# Patient Record
Sex: Female | Born: 1950
Health system: Southern US, Community
[De-identification: ages and names within clinical notes are randomized; demographics above are authoritative.]

## PROBLEM LIST (undated history)

## (undated) DIAGNOSIS — M1990 Unspecified osteoarthritis, unspecified site: Secondary | ICD-10-CM

## (undated) DIAGNOSIS — I499 Cardiac arrhythmia, unspecified: Secondary | ICD-10-CM

## (undated) DIAGNOSIS — J189 Pneumonia, unspecified organism: Secondary | ICD-10-CM

## (undated) DIAGNOSIS — I10 Essential (primary) hypertension: Secondary | ICD-10-CM

## (undated) DIAGNOSIS — N898 Other specified noninflammatory disorders of vagina: Secondary | ICD-10-CM

## (undated) DIAGNOSIS — E785 Hyperlipidemia, unspecified: Secondary | ICD-10-CM

## (undated) HISTORY — DX: Essential (primary) hypertension: I10

## (undated) HISTORY — DX: Cardiac arrhythmia, unspecified: I49.9

## (undated) HISTORY — PX: CHOLECYSTECTOMY: SHX55

## (undated) HISTORY — PX: APPENDECTOMY: SHX54

## (undated) HISTORY — DX: Hyperlipidemia, unspecified: E78.5

## (undated) HISTORY — DX: Other specified noninflammatory disorders of vagina: N89.8

---

## 1991-07-19 HISTORY — PX: TEMPOROMANDIBULAR JOINT SURGERY: SHX35

## 2005-03-03 ENCOUNTER — Ambulatory Visit (HOSPITAL_COMMUNITY): Admission: RE | Admit: 2005-03-03 | Discharge: 2005-03-03 | Payer: Self-pay | Admitting: Obstetrics and Gynecology

## 2006-01-23 ENCOUNTER — Encounter: Admission: RE | Admit: 2006-01-23 | Discharge: 2006-01-23 | Payer: Self-pay | Admitting: Family Medicine

## 2007-01-18 ENCOUNTER — Ambulatory Visit (HOSPITAL_COMMUNITY): Admission: RE | Admit: 2007-01-18 | Discharge: 2007-01-18 | Payer: Self-pay | Admitting: Family Medicine

## 2007-02-14 ENCOUNTER — Ambulatory Visit (HOSPITAL_COMMUNITY): Admission: RE | Admit: 2007-02-14 | Discharge: 2007-02-14 | Payer: Self-pay | Admitting: Family Medicine

## 2008-07-01 ENCOUNTER — Other Ambulatory Visit: Admission: RE | Admit: 2008-07-01 | Discharge: 2008-07-01 | Payer: Self-pay | Admitting: Obstetrics and Gynecology

## 2009-12-28 ENCOUNTER — Other Ambulatory Visit
Admission: RE | Admit: 2009-12-28 | Discharge: 2009-12-28 | Payer: Self-pay | Source: Home / Self Care | Admitting: Internal Medicine

## 2010-01-21 ENCOUNTER — Ambulatory Visit (HOSPITAL_COMMUNITY)
Admission: RE | Admit: 2010-01-21 | Discharge: 2010-01-21 | Payer: Self-pay | Source: Home / Self Care | Admitting: Obstetrics and Gynecology

## 2011-02-03 ENCOUNTER — Other Ambulatory Visit: Payer: Self-pay | Admitting: Obstetrics and Gynecology

## 2011-02-03 DIAGNOSIS — Z139 Encounter for screening, unspecified: Secondary | ICD-10-CM

## 2011-02-08 ENCOUNTER — Other Ambulatory Visit: Payer: Self-pay | Admitting: Adult Health

## 2011-02-08 ENCOUNTER — Ambulatory Visit (HOSPITAL_COMMUNITY)
Admission: RE | Admit: 2011-02-08 | Discharge: 2011-02-08 | Disposition: A | Discharge status: Home / Self Care | Payer: BC Managed Care – PPO | Source: Ambulatory Visit | Attending: Obstetrics and Gynecology | Admitting: Obstetrics and Gynecology

## 2011-02-08 ENCOUNTER — Other Ambulatory Visit (HOSPITAL_COMMUNITY)
Admission: RE | Admit: 2011-02-08 | Discharge: 2011-02-08 | Disposition: A | Discharge status: Home / Self Care | Payer: BC Managed Care – PPO | Source: Ambulatory Visit | Attending: Obstetrics and Gynecology | Admitting: Obstetrics and Gynecology

## 2011-02-08 DIAGNOSIS — Z139 Encounter for screening, unspecified: Secondary | ICD-10-CM

## 2011-02-08 DIAGNOSIS — Z1231 Encounter for screening mammogram for malignant neoplasm of breast: Secondary | ICD-10-CM | POA: Insufficient documentation

## 2011-02-08 DIAGNOSIS — Z01419 Encounter for gynecological examination (general) (routine) without abnormal findings: Secondary | ICD-10-CM | POA: Insufficient documentation

## 2011-02-16 ENCOUNTER — Telehealth: Payer: Self-pay

## 2011-02-16 NOTE — Telephone Encounter (Signed)
Gastroenterology Pre-Procedure Form  Request Date: 02/09/2011     Requesting Physician: Cyril Mourning, [facility]     PATIENT INFORMATION:  Pamela Walter is a 60 y.o., female (DOB=22-Jul-1950).  PROCEDURE: Procedure(s) requested: colonoscopy Procedure Reason: screening for colon cancer  PATIENT REVIEW QUESTIONS: The patient reports the following:   1. Diabetes Melitis: no 2. Joint replacements in the past 12 months: no 3. Major health problems in the past 3 months: no 4. Has an artificial valve or MVP:no 5. Has been advised in past to take antibiotics in advance of a procedure like teeth cleaning: no}    MEDICATIONS & ALLERGIES:    Patient reports the following regarding taking any blood thinners:   Plavix? no Aspirin? Yes Coumadin? No  Patient confirms/reports the following medications:  Current Outpatient Prescriptions  Medication Sig Dispense Refill  . aspirin 81 MG tablet Take 81 mg by mouth daily.        . Biotin 1000 MCG tablet Take 1,000 mcg by mouth 2 (two) times daily before a meal.        . metoprolol tartrate (LOPRESSOR) 25 MG tablet Take 25 mg by mouth 1 day or 1 dose.        . naproxen sodium (ANAPROX) 220 MG tablet Take 220 mg by mouth 2 (two) times daily with a meal. Takes two tablets once a day       . Omega-3 Fatty Acids (FISH OIL) 1000 MG CAPS Take by mouth 1 day or 1 dose.        Marland Kitchen UNKNOWN TO PATIENT Vitamin D 400 IU daily       . vitamin B-12 (CYANOCOBALAMIN) 500 MCG tablet Take 500 mcg by mouth daily.          Patient confirms/reports the following allergies:  No Known Allergies  Patient is appropriate to schedule for requested procedure(s): yes  AUTHORIZATION INFORMATION Primary Insurance: BCBS     ID #: [id number]     Group Pre-Cert / Berkley Harvey required:   Secondary Insurance:   ID   Group Pre-Cert / Berkley Harvey required: Pre-Cert / Auth    No orders of the defined types were placed in this encounter.    SCHEDULE INFORMATION: Procedure has been  scheduled as follows:  Date: 03/01/2011    Time: 7:30 AM  Location: [facility] Short Stay  This Gastroenterology Pre-Precedure Form is being routed to the following provider(s) for review: Jonette Eva, MD   Mailed the Rx and instructions.

## 2011-02-17 NOTE — Telephone Encounter (Signed)
moviprep

## 2011-03-01 ENCOUNTER — Encounter (HOSPITAL_COMMUNITY)
Admission: RE | Disposition: A | Discharge status: Home / Self Care | Payer: Self-pay | Source: Ambulatory Visit | Attending: Gastroenterology

## 2011-03-01 ENCOUNTER — Encounter: Payer: BC Managed Care – PPO | Admitting: Gastroenterology

## 2011-03-01 ENCOUNTER — Ambulatory Visit (HOSPITAL_COMMUNITY)
Admission: RE | Admit: 2011-03-01 | Discharge: 2011-03-01 | Disposition: A | Discharge status: Home / Self Care | Payer: BC Managed Care – PPO | Source: Ambulatory Visit | Attending: Gastroenterology | Admitting: Gastroenterology

## 2011-03-01 ENCOUNTER — Encounter (HOSPITAL_COMMUNITY): Payer: Self-pay

## 2011-03-01 DIAGNOSIS — Z1211 Encounter for screening for malignant neoplasm of colon: Secondary | ICD-10-CM | POA: Insufficient documentation

## 2011-03-01 DIAGNOSIS — K648 Other hemorrhoids: Secondary | ICD-10-CM | POA: Insufficient documentation

## 2011-03-01 DIAGNOSIS — Z7982 Long term (current) use of aspirin: Secondary | ICD-10-CM | POA: Insufficient documentation

## 2011-03-01 HISTORY — PX: COLONOSCOPY: SHX5424

## 2011-03-01 HISTORY — DX: Unspecified osteoarthritis, unspecified site: M19.90

## 2011-03-01 SURGERY — COLONOSCOPY
Anesthesia: Moderate Sedation

## 2011-03-01 MED ORDER — MEPERIDINE HCL 100 MG/ML IJ SOLN
INTRAMUSCULAR | Status: DC | PRN
  Administered 2011-03-01: 50 mg via INTRAVENOUS
  Administered 2011-03-01: 25 mg via INTRAVENOUS

## 2011-03-01 MED ORDER — MIDAZOLAM HCL 5 MG/5ML IJ SOLN
INTRAMUSCULAR | Status: DC | PRN
  Administered 2011-03-01: 2 mg via INTRAVENOUS
  Administered 2011-03-01: 1 mg via INTRAVENOUS
  Administered 2011-03-01: 2 mg via INTRAVENOUS

## 2011-03-01 MED ORDER — SODIUM CHLORIDE 0.45 % IV SOLN
Freq: Once | INTRAVENOUS | Status: AC
  Administered 2011-03-01: 11:00:00 via INTRAVENOUS

## 2011-03-01 MED ORDER — MIDAZOLAM HCL 5 MG/5ML IJ SOLN
INTRAMUSCULAR | Status: AC
  Filled 2011-03-01: qty 10

## 2011-03-01 MED ORDER — MEPERIDINE HCL 100 MG/ML IJ SOLN
INTRAMUSCULAR | Status: AC
  Filled 2011-03-01: qty 2

## 2011-03-01 NOTE — H&P (Signed)
  Primary Care Physician:  Tilda Burrow, MD, MD Primary Gastroenterologist:  Dr. Darrick Penna  Pre-Procedure History & Physical: HPI:  Pamela Walter is a 60 y.o. female is here for a screening colonoscopy.   Past Medical History  Diagnosis Date  . Arthritis     Past Surgical History  Procedure Date  . Temporomandibular joint surgery 1993  . Cholecystectomy     30 years ago    Prior to Admission medications   Medication Sig Start Date End Date Taking? Authorizing Provider  aspirin 81 MG tablet Take 81 mg by mouth daily.      Historical Provider, MD  Biotin 1000 MCG tablet Take 1,000 mcg by mouth 2 (two) times daily before a meal.      Historical Provider, MD  metoprolol tartrate (LOPRESSOR) 25 MG tablet Take 25 mg by mouth 1 day or 1 dose.      Historical Provider, MD  naproxen sodium (ANAPROX) 220 MG tablet Take 220 mg by mouth 2 (two) times daily with a meal. Takes two tablets once a day     Historical Provider, MD  Omega-3 Fatty Acids (FISH OIL) 1000 MG CAPS Take by mouth 1 day or 1 dose.      Historical Provider, MD  UNKNOWN TO PATIENT Vitamin D 400 IU daily     Historical Provider, MD  vitamin B-12 (CYANOCOBALAMIN) 500 MCG tablet Take 500 mcg by mouth daily.      Historical Provider, MD    Allergies as of 02/16/2011  . (No Known Allergies)    History reviewed. No pertinent family history.  History   Social History  . Marital Status: Married    Spouse Name: N/A    Number of Children: N/A  . Years of Education: N/A   Occupational History  . Not on file.   Social History Main Topics  . Smoking status: Never Smoker   . Smokeless tobacco: Not on file  . Alcohol Use: No  . Drug Use: No  . Sexually Active:    Other Topics Concern  . Not on file   Social History Narrative  . No narrative on file    Review of Systems: See HPI, otherwise negative ROS  Physical Exam: BP 147/63  Pulse 55  Temp(Src) 97.6 F (36.4 C) (Oral)  Resp 21  Ht 5\' 4"  (1.626 m)   Wt 180 lb (81.647 kg)  BMI 30.90 kg/m2  SpO2 100% General:   Alert,  pleasant and cooperative in NAD Head:  Normocephalic and atraumatic. Neck:  Supple; no masses or thyromegaly. Lungs:  Clear throughout to auscultation.    Heart:  Regular rate and rhythm. Abdomen:  Soft, nontender and nondistended. Normal bowel sounds, without guarding, and without rebound.   Neurologic:  Alert and  oriented x4;  grossly normal neurologically.  Impression/Plan: Pamela Walter is now here to undergo a screening colonoscopy.  Risks, benefits, limitations, and alternatives regarding colonoscopy have been reviewed with the patient. Questions have been answered.

## 2011-03-08 ENCOUNTER — Encounter (HOSPITAL_COMMUNITY): Payer: Self-pay | Admitting: Gastroenterology

## 2011-11-09 ENCOUNTER — Other Ambulatory Visit: Payer: Self-pay | Admitting: Obstetrics and Gynecology

## 2012-03-15 ENCOUNTER — Other Ambulatory Visit: Payer: Self-pay | Admitting: Adult Health

## 2012-03-15 DIAGNOSIS — Z139 Encounter for screening, unspecified: Secondary | ICD-10-CM

## 2012-03-22 ENCOUNTER — Ambulatory Visit (HOSPITAL_COMMUNITY)
Admission: RE | Admit: 2012-03-22 | Discharge: 2012-03-22 | Disposition: A | Discharge status: Home / Self Care | Payer: BC Managed Care – PPO | Source: Ambulatory Visit | Attending: Adult Health | Admitting: Adult Health

## 2012-03-22 DIAGNOSIS — Z1231 Encounter for screening mammogram for malignant neoplasm of breast: Secondary | ICD-10-CM | POA: Insufficient documentation

## 2012-03-22 DIAGNOSIS — Z139 Encounter for screening, unspecified: Secondary | ICD-10-CM

## 2012-11-22 ENCOUNTER — Other Ambulatory Visit (HOSPITAL_COMMUNITY): Payer: Self-pay | Admitting: Obstetrics and Gynecology

## 2014-06-05 ENCOUNTER — Other Ambulatory Visit (HOSPITAL_COMMUNITY)
Admission: RE | Admit: 2014-06-05 | Discharge: 2014-06-05 | Disposition: A | Discharge status: Home / Self Care | Payer: BC Managed Care – PPO | Source: Ambulatory Visit | Attending: Adult Health | Admitting: Adult Health

## 2014-06-05 ENCOUNTER — Ambulatory Visit (INDEPENDENT_AMBULATORY_CARE_PROVIDER_SITE_OTHER): Payer: BC Managed Care – PPO | Admitting: Adult Health

## 2014-06-05 ENCOUNTER — Encounter: Payer: Self-pay | Admitting: Adult Health

## 2014-06-05 ENCOUNTER — Other Ambulatory Visit: Payer: Self-pay | Admitting: Adult Health

## 2014-06-05 VITALS — BP 148/70 | HR 76 | Temp 98.0°F | Ht 63.0 in | Wt 171.0 lb

## 2014-06-05 DIAGNOSIS — Z01419 Encounter for gynecological examination (general) (routine) without abnormal findings: Secondary | ICD-10-CM

## 2014-06-05 DIAGNOSIS — Z1151 Encounter for screening for human papillomavirus (HPV): Secondary | ICD-10-CM | POA: Diagnosis present

## 2014-06-05 DIAGNOSIS — Z1212 Encounter for screening for malignant neoplasm of rectum: Secondary | ICD-10-CM

## 2014-06-05 DIAGNOSIS — Z1231 Encounter for screening mammogram for malignant neoplasm of breast: Secondary | ICD-10-CM

## 2014-06-05 DIAGNOSIS — N898 Other specified noninflammatory disorders of vagina: Secondary | ICD-10-CM | POA: Insufficient documentation

## 2014-06-05 HISTORY — DX: Other specified noninflammatory disorders of vagina: N89.8

## 2014-06-05 LAB — POCT WET PREP (WET MOUNT)

## 2014-06-05 LAB — HEMOCCULT GUIAC POC 1CARD (OFFICE): FECAL OCCULT BLD: NEGATIVE

## 2014-06-05 MED ORDER — PROMETHAZINE HCL 25 MG PO TABS: 25.0000 mg | ORAL_TABLET | Freq: Four times a day (QID) | ORAL | Status: DC | PRN

## 2014-06-05 NOTE — Patient Instructions (Signed)
Physical in 1 year Mammogram 11/23 at 7:30 am colonoscopy per GI  Labs with Dr Criss Rosales  Get flu shot

## 2014-06-05 NOTE — Progress Notes (Signed)
Patient ID: Pamela Walter, female   DOB: 05-14-1951, 63 y.o.   MRN: [id number] History of Present Illness: Pamela Walter is a 63 year old black female, widowed in for pap and physical.She had some nausea and diarrhea this morning better now but feels swimmy headed at times.Has vaginal discharge at times, no itching or burning.   Current Medications, Allergies, Past Medical History, Past Surgical History, Family History and Social History were reviewed in [employer] record.     Review of Systems: Patient denies any headaches, blurred vision, shortness of breath, chest pain, abdominal pain, problems with bowel movements, urination, or intercourse. Not having sex, has pain in right knee due to arthritis.Seeing Dr Criss Rosales.No mood swings.    Physical Exam:BP 148/70 mmHg  Pulse 76  Temp(Src) 98 F (36.7 C)  Ht 5\' 3"  (1.6 m)  Wt 171 lb (77.565 kg)  BMI 30.30 kg/m2 General:  Well developed, well nourished, no acute distress Skin:  Warm and dry Neck:  Midline trachea, normal thyroid, no carotid bruits Lungs; Clear to auscultation bilaterally Breast:  No dominant palpable mass, retraction, or nipple discharge Cardiovascular: Regular rate and rhythm Abdomen:  Soft, non tender, no hepatosplenomegaly Pelvic:  External genitalia is normal in appearance for age, no lesions.  The vagina is pale with loss of rugae and has creamy discharge without odor.The cervix is atrophic, pap with HPV performed.  Uterus is felt to be normal size, shape, and contour.  No   adnexal masses or tenderness noted.wet prep has few WBCs and parabasal cells. Rectal: Good sphincter tone, no polyps, or hemorrhoids felt.  Hemoccult negative. Extremities:  No varicosities noted,some swelling right knee Psych:  No mood changes,alert and cooperative,seems happy, still working at [employer] died this year.   Impression: Well woman exam with pap Vaginal discharge    Plan: Try Luvena to balance  pH Physical in 1 year Mammogram scheduled for 11/23 at 7:30 am at [facility] Colonoscopy per GI Labs with Dr Criss Rosales Rx phenergan 25 mg #15 1 every 6 hours prn N/V with 1 refill Can try antivert OTC if needed

## 2014-06-09 ENCOUNTER — Ambulatory Visit (HOSPITAL_COMMUNITY)
Admission: RE | Admit: 2014-06-09 | Discharge: 2014-06-09 | Disposition: A | Discharge status: Home / Self Care | Payer: BC Managed Care – PPO | Source: Ambulatory Visit | Attending: Adult Health | Admitting: Adult Health

## 2014-06-09 DIAGNOSIS — Z1231 Encounter for screening mammogram for malignant neoplasm of breast: Secondary | ICD-10-CM

## 2014-06-09 LAB — CYTOLOGY - PAP

## 2015-08-24 ENCOUNTER — Encounter: Payer: Self-pay | Admitting: Adult Health

## 2015-08-24 ENCOUNTER — Ambulatory Visit (INDEPENDENT_AMBULATORY_CARE_PROVIDER_SITE_OTHER): Payer: BLUE CROSS/BLUE SHIELD | Admitting: Adult Health

## 2015-08-24 VITALS — BP 120/60 | HR 68 | Ht 62.2 in | Wt 171.0 lb

## 2015-08-24 DIAGNOSIS — Z1212 Encounter for screening for malignant neoplasm of rectum: Secondary | ICD-10-CM

## 2015-08-24 DIAGNOSIS — Z01419 Encounter for gynecological examination (general) (routine) without abnormal findings: Secondary | ICD-10-CM

## 2015-08-24 LAB — HEMOCCULT GUIAC POC 1CARD (OFFICE): Fecal Occult Blood, POC: NEGATIVE

## 2015-08-24 NOTE — Patient Instructions (Signed)
Pap and physical in 1 year Mammogram yearly Labs with PCP 

## 2015-08-24 NOTE — Progress Notes (Signed)
Patient ID: Pamela Walter, female   DOB: Apr 26, 1951, 65 y.o.   MRN: [id number] History of Present Illness: Ying is a 65 year old  Black female, widowed in for a well woman gyn exam, she had normal pap with negative HPV in 2015.She says she had flu shot, shingles shot and pneumonia shot in November.She is thinking about retiring in the near future. PCP is Dr Criss Rosales, who does her labs and meds.   Current Medications, Allergies, Past Medical History, Past Surgical History, Family History and Social History were reviewed in [employer] record.     Review of Systems: Patient denies any headaches, hearing loss, fatigue, blurred vision, shortness of breath, chest pain, abdominal pain, problems with bowel movements, urination, or intercourse(not often). No joint pain or mood swings.Has noticed occasional discharge with slight odor after exercising.    Physical Exam:BP 120/60 mmHg  Pulse 68  Ht 5' 2.2" (1.58 m)  Wt 171 lb (77.565 kg)  BMI 31.07 kg/m2 General:  Well developed, well nourished, no acute distress Skin:  Warm and dry Neck:  Midline trachea, normal thyroid, good ROM, no lymphadenopathy, no carotid bruits heard Lungs; Clear to auscultation bilaterally Breast:  No dominant palpable mass, retraction, or nipple discharge Cardiovascular: Regular rate and rhythm Abdomen:  Soft, non tender, no hepatosplenomegaly Pelvic:  External genitalia is normal in appearance, no lesions.  The vagina has decreased color, moisture and rugae. Urethra has no lesions or masses. The cervix is smooth.  Uterus is felt to be normal size, shape, and contour.  No adnexal masses or tenderness noted.Bladder is non tender, no masses felt. Rectal: Good sphincter tone, no polyps, or hemorrhoids felt.  Hemoccult negative. Extremities/musculoskeletal:  No swelling or varicosities noted, no clubbing or cyanosis Psych:  No mood changes, alert and cooperative,seems happy   Impression: Well  woman gyn exam no pap    Plan: Pap and physical in 1 year Mammogram yearly Labs with PCP  Colonoscopy per GI

## 2015-10-28 ENCOUNTER — Other Ambulatory Visit: Payer: Self-pay | Admitting: Adult Health

## 2015-10-28 DIAGNOSIS — Z1231 Encounter for screening mammogram for malignant neoplasm of breast: Secondary | ICD-10-CM

## 2015-10-29 ENCOUNTER — Other Ambulatory Visit: Payer: Self-pay | Admitting: Adult Health

## 2015-10-29 ENCOUNTER — Ambulatory Visit (HOSPITAL_COMMUNITY): Payer: Self-pay

## 2015-10-29 ENCOUNTER — Ambulatory Visit (HOSPITAL_COMMUNITY)
Admission: RE | Admit: 2015-10-29 | Discharge: 2015-10-29 | Disposition: A | Discharge status: Home / Self Care | Payer: BLUE CROSS/BLUE SHIELD | Source: Ambulatory Visit | Attending: Adult Health | Admitting: Adult Health

## 2015-10-29 DIAGNOSIS — Z1231 Encounter for screening mammogram for malignant neoplasm of breast: Secondary | ICD-10-CM | POA: Diagnosis present

## 2016-05-14 DIAGNOSIS — J9801 Acute bronchospasm: Secondary | ICD-10-CM | POA: Diagnosis not present

## 2016-06-03 DIAGNOSIS — R69 Illness, unspecified: Secondary | ICD-10-CM | POA: Diagnosis not present

## 2016-06-04 DIAGNOSIS — R05 Cough: Secondary | ICD-10-CM | POA: Diagnosis not present

## 2016-07-14 ENCOUNTER — Encounter (INDEPENDENT_AMBULATORY_CARE_PROVIDER_SITE_OTHER): Payer: Self-pay | Admitting: Orthopaedic Surgery

## 2016-07-14 ENCOUNTER — Ambulatory Visit (INDEPENDENT_AMBULATORY_CARE_PROVIDER_SITE_OTHER): Payer: Medicare HMO | Admitting: Orthopaedic Surgery

## 2016-07-14 VITALS — BP 137/78 | HR 63 | Ht 64.0 in | Wt 175.0 lb

## 2016-07-14 DIAGNOSIS — Z8739 Personal history of other diseases of the musculoskeletal system and connective tissue: Secondary | ICD-10-CM | POA: Diagnosis not present

## 2016-07-14 MED ORDER — LIDOCAINE HCL 1 % IJ SOLN
1.0000 mL | INTRAMUSCULAR | Status: AC | PRN
  Administered 2016-07-14: 1 mL

## 2016-07-14 MED ORDER — BUPIVACAINE HCL 0.5 % IJ SOLN
3.0000 mL | INTRAMUSCULAR | Status: AC | PRN
  Administered 2016-07-14: 3 mL via INTRA_ARTICULAR

## 2016-07-14 MED ORDER — METHYLPREDNISOLONE ACETATE 40 MG/ML IJ SUSP
40.0000 mg | INTRAMUSCULAR | Status: AC | PRN
  Administered 2016-07-14: 40 mg via INTRA_ARTICULAR

## 2016-07-14 NOTE — Progress Notes (Signed)
Office Visit Note   Patient: Pamela Walter           Date of Birth: February 15, 1951           MRN: [id number] Visit Date: 07/14/2016              Requested by: Lucianne Lei, MD [address] [place], [place] [address] PCP: Elyn Peers, MD   Assessment & Plan: Visit Diagnoses:  1. H/O calcium pyrophosphate deposition disease (CPPD)           With recurrent synovitis right knee. Last injection was February 2017.  Plan: Right knee injection performed with good relief. Also follow-up when necessary.  Follow-Up Instructions: Return if symptoms worsen or fail to improve.   Orders:  Orders Placed This Encounter  Procedures  . Large Joint Injection/Arthrocentesis   No orders of the defined types were placed in this encounter.     Procedures: Large Joint Inj Date/Time: 07/14/2016 2:39 PM Performed by: Marybelle Killings Authorized by: Marybelle Killings   Consent Given by:  Patient Site marked: the procedure site was marked   Indications:  Pain and joint swelling Location:  Knee Site:  R knee Needle Size:  22 G Needle Length:  1.5 inches Approach:  Anterolateral Ultrasound Guidance: No   Fluoroscopic Guidance: No   Arthrogram: No   Medications:  1 mL lidocaine 1 %; 40 mg methylPREDNISolone acetate 40 MG/ML; 3 mL bupivacaine 0.5 % Aspiration Attempted: No   Patient tolerance:  Patient tolerated the procedure well with no immediate complications     Clinical Data: No additional findings.   Subjective: Chief Complaint  Patient presents with  . Right Knee - Pain    Patient is here with complaint of right knee pain. She has history of osteoarthritis right knee. Her last injection was 08/26/2015 and she got a lot of relief from that. She would like another injection. She is taking Naproxen.     Review of Systems  Constitutional: Negative for chills and diaphoresis.  HENT: Negative for ear discharge, ear pain and nosebleeds.   Eyes: Negative for discharge and visual  disturbance.  Respiratory: Negative for cough, choking and shortness of breath.   Cardiovascular: Negative for chest pain and palpitations.  Gastrointestinal: Negative for abdominal distention and abdominal pain.  Endocrine: Negative for cold intolerance and heat intolerance.  Genitourinary: Negative for flank pain and hematuria.  Musculoskeletal:       History of recurrent occasional synovitis of her knee with slight calcification of the meniscus. Mild medial joint narrowing.  Skin: Negative for rash and wound.  Neurological: Negative for seizures and speech difficulty.  Hematological: Negative for adenopathy. Does not bruise/bleed easily.  Psychiatric/Behavioral: Negative for agitation and suicidal ideas.     Objective: Vital Signs: BP 137/78   Pulse 63   Ht 5\' 4"  (1.626 m)   Wt 175 lb (79.4 kg)   BMI 30.04 kg/m   Physical Exam  Constitutional: She is oriented to person, place, and time. She appears well-developed.  HENT:  Head: Normocephalic.  Right Ear: External ear normal.  Left Ear: External ear normal.  Eyes: Pupils are equal, round, and reactive to light.  Neck: No tracheal deviation present. No thyromegaly present.  Cardiovascular: Normal rate.   Pulmonary/Chest: Effort normal.  Abdominal: Soft.  Musculoskeletal:  Good range of motion of her hips negative straight leg raising negative hyperextension she has some joint line tenderness 2+ knee effusion on the right none on  the left. No increased warmth. Distal pulses are 2+ negative Homans sign. Anterior tib EHL is strong sensation her foot is intact.  Neurological: She is alert and oriented to person, place, and time.  Skin: Skin is warm and dry.  Psychiatric: She has a normal mood and affect. Her behavior is normal.    Ortho Exam no synovitis of the fingers or wrist. No rash over exposed skin.  Specialty Comments:  No specialty comments available.  Imaging: No results found.   PMFS History: Patient Active  Problem List   Diagnosis Date Noted  . H/O calcium pyrophosphate deposition disease (CPPD) 07/14/2016  . Vaginal discharge 06/05/2014   Past Medical History:  Diagnosis Date  . Arthritis   . Hyperlipidemia    borderline  . Hypertension   . Irregular heart rate   . Vaginal discharge 06/05/2014    Family History  Problem Relation Age of Onset  . Hypertension Mother   . Diabetes Mother   . Hypertension Father   . Hypertension Sister   . Cancer Brother     prostate  . Diabetes Son   . Diabetes Brother   . Dementia Brother     Past Surgical History:  Procedure Laterality Date  . CHOLECYSTECTOMY     30 years ago  . COLONOSCOPY  03/01/2011   Procedure: COLONOSCOPY;  Surgeon: Dorothyann Peng, MD;  Location: [facility];  Service: Endoscopy;  Laterality: N/A;  . [place]   Social History   Occupational History  . Not on file.   Social History Main Topics  . Smoking status: Former Smoker    Types: Cigarettes  . Smokeless tobacco: Never Used  . Alcohol use Yes     Comment: wine once a month  . Drug use: No  . Sexual activity: No

## 2016-07-19 DIAGNOSIS — E785 Hyperlipidemia, unspecified: Secondary | ICD-10-CM | POA: Diagnosis not present

## 2016-07-19 DIAGNOSIS — I10 Essential (primary) hypertension: Secondary | ICD-10-CM | POA: Diagnosis not present

## 2016-07-19 DIAGNOSIS — M130 Polyarthritis, unspecified: Secondary | ICD-10-CM | POA: Diagnosis not present

## 2016-11-16 DIAGNOSIS — I10 Essential (primary) hypertension: Secondary | ICD-10-CM | POA: Diagnosis not present

## 2016-11-16 DIAGNOSIS — M130 Polyarthritis, unspecified: Secondary | ICD-10-CM | POA: Diagnosis not present

## 2016-11-16 DIAGNOSIS — E785 Hyperlipidemia, unspecified: Secondary | ICD-10-CM | POA: Diagnosis not present

## 2016-12-20 ENCOUNTER — Other Ambulatory Visit: Payer: Self-pay | Admitting: Adult Health

## 2016-12-20 DIAGNOSIS — Z1231 Encounter for screening mammogram for malignant neoplasm of breast: Secondary | ICD-10-CM

## 2017-01-02 ENCOUNTER — Ambulatory Visit (HOSPITAL_COMMUNITY)
Admission: RE | Admit: 2017-01-02 | Discharge: 2017-01-02 | Disposition: A | Discharge status: Home / Self Care | Payer: Medicare HMO | Source: Ambulatory Visit | Attending: Adult Health | Admitting: Adult Health

## 2017-01-02 DIAGNOSIS — Z1231 Encounter for screening mammogram for malignant neoplasm of breast: Secondary | ICD-10-CM

## 2017-03-18 DIAGNOSIS — G5603 Carpal tunnel syndrome, bilateral upper limbs: Secondary | ICD-10-CM | POA: Diagnosis not present

## 2017-03-18 DIAGNOSIS — R785 Finding of other psychotropic drug in blood: Secondary | ICD-10-CM | POA: Diagnosis not present

## 2017-03-18 DIAGNOSIS — M130 Polyarthritis, unspecified: Secondary | ICD-10-CM | POA: Diagnosis not present

## 2017-03-18 DIAGNOSIS — I10 Essential (primary) hypertension: Secondary | ICD-10-CM | POA: Diagnosis not present

## 2017-03-18 DIAGNOSIS — R69 Illness, unspecified: Secondary | ICD-10-CM | POA: Diagnosis not present

## 2017-05-24 ENCOUNTER — Encounter (INDEPENDENT_AMBULATORY_CARE_PROVIDER_SITE_OTHER): Payer: Self-pay | Admitting: Orthopaedic Surgery

## 2017-05-24 ENCOUNTER — Ambulatory Visit (INDEPENDENT_AMBULATORY_CARE_PROVIDER_SITE_OTHER): Payer: Medicare HMO | Admitting: Orthopaedic Surgery

## 2017-05-24 VITALS — BP 144/67 | HR 72 | Resp 12 | Ht 64.0 in | Wt 190.0 lb

## 2017-05-24 DIAGNOSIS — M25561 Pain in right knee: Secondary | ICD-10-CM

## 2017-05-24 DIAGNOSIS — M65331 Trigger finger, right middle finger: Secondary | ICD-10-CM

## 2017-05-24 DIAGNOSIS — G8929 Other chronic pain: Secondary | ICD-10-CM

## 2017-05-24 MED ORDER — METHYLPREDNISOLONE ACETATE 40 MG/ML IJ SUSP
80.0000 mg | INTRAMUSCULAR | Status: AC | PRN
  Administered 2017-05-24: 80 mg

## 2017-05-24 MED ORDER — LIDOCAINE HCL 1 % IJ SOLN
2.0000 mL | INTRAMUSCULAR | Status: AC | PRN
  Administered 2017-05-24: 2 mL

## 2017-05-24 NOTE — Progress Notes (Signed)
Office Visit Note   Patient: Pamela Walter           Date of Birth: Apr 03, 1951           MRN: [id number] Visit Date: 05/24/2017              Requested by: Lucianne Lei, MD [address] [place], [place] [address] PCP: Lucianne Lei, MD   Assessment & Plan: Visit Diagnoses:  1. Chronic pain of right knee   2. Trigger finger, right middle finger     Plan: Repeat cortisone injection medial compartment right knee for the osteoarthritis. Dorsal finger splint right long finger for triggering. Office return as needed. Have had a long discussion regarding the osteoarthritis of her right knee and what she can expect over time. Has early triggering of the right long finger. Also discussed surgery and injection. Shewould like to just try the splint  Follow-Up Instructions: Return if symptoms worsen or fail to improve.   Orders:  No orders of the defined types were placed in this encounter.  No orders of the defined types were placed in this encounter.     Procedures: Large Joint Inj: R knee on 05/24/2017 11:11 AM Indications: pain and diagnostic evaluation Details: 25 G 1.5 in needle, anteromedial approach  Arthrogram: No  Medications: 2 mL lidocaine 1 %; 80 mg methylPREDNISolone acetate 40 MG/ML Procedure, treatment alternatives, risks and benefits explained, specific risks discussed. Consent was given by the patient. Immediately prior to procedure a time out was called to verify the correct patient, procedure, equipment, support staff and site/side marked as required. Patient was prepped and draped in the usual sterile fashion.       Clinical Data: No additional findings.   Subjective: Chief Complaint  Patient presents with  . Right Knee - Pain, Edema  Pamela Walter has been seen in the past for the osteoarthritis in her right knee. In either January or February she had a cortisone injection which worked until "just recently". She's having predominantly medial joint  pain. Occasionally she may have a little swelling. She has more pain when she is up and about and active. She is fine sitting and sleeping. He has tried over-the-counter medicines with some temporary relief. In addition she's noted some early triggering of the right long finger. No history of injury or trauma. She cannot actively trigger the finger but" and will "get caught". No numbness or tingling. Pain is localized along the volar aspect of the long finger near the metacarpal phalangeal joint  HPI  Review of Systems  Constitutional: Negative for chills, fatigue and fever.  Eyes: Negative for itching.  Respiratory: Negative for chest tightness and shortness of breath.   Cardiovascular: Negative for chest pain, palpitations and leg swelling.  Gastrointestinal: Negative for blood in stool, constipation and diarrhea.  Endocrine: Negative for polyuria.  Genitourinary: Negative for dysuria.  Musculoskeletal: Positive for joint swelling. Negative for back pain, neck pain and neck stiffness.  Allergic/Immunologic: Negative for immunocompromised state.  Neurological: Positive for weakness. Negative for dizziness and numbness.  Hematological: Does not bruise/bleed easily.  Psychiatric/Behavioral: The patient is not nervous/anxious.      Objective: Vital Signs: BP (!) 144/67   Pulse 72   Resp 12   Ht 5\' 4"  (1.626 m)   Wt 190 lb (86.2 kg)   BMI 32.61 kg/m   Physical Exam  Ortho Exam Right knee with predominantly medial joint tenderness. Very minimal effusion. Some patellar crepitation. No limp.  No instability with varus and valgus stress. Negative anterior drawer sign. Full extension and flexed over 105. No calf pain. No popliteal fullness. Tenderness was small mass formation on the volar aspect of the long finger the metacarpal phalangeal joint right hand. No active triggering. No swelling of the finger. Neurovascular exam intact. Specialty Comments:  No specialty comments  available.  Imaging: No results found.   PMFS History: Patient Active Problem List   Diagnosis Date Noted  . H/O calcium pyrophosphate deposition disease (CPPD) 07/14/2016  . Vaginal discharge 06/05/2014   Past Medical History:  Diagnosis Date  . Arthritis   . Hyperlipidemia    borderline  . Hypertension   . Irregular heart rate   . Vaginal discharge 06/05/2014    Family History  Problem Relation Age of Onset  . Hypertension Mother   . Diabetes Mother   . Hypertension Father   . Hypertension Sister   . Cancer Brother        prostate  . Diabetes Son   . Diabetes Brother   . Dementia Brother     Past Surgical History:  Procedure Laterality Date  . CHOLECYSTECTOMY     30 years ago  . [place]   Social History   Occupational History  . Not on file  Tobacco Use  . Smoking status: Former Smoker    Types: Cigarettes  . Smokeless tobacco: Never Used  Substance and Sexual Activity  . Alcohol use: Yes    Comment: wine once a month  . Drug use: No  . Sexual activity: No    Birth control/protection: Post-menopausal

## 2017-09-21 DIAGNOSIS — M25541 Pain in joints of right hand: Secondary | ICD-10-CM | POA: Diagnosis not present

## 2017-09-21 DIAGNOSIS — G603 Idiopathic progressive neuropathy: Secondary | ICD-10-CM | POA: Diagnosis not present

## 2017-09-21 DIAGNOSIS — M130 Polyarthritis, unspecified: Secondary | ICD-10-CM | POA: Diagnosis not present

## 2017-09-21 DIAGNOSIS — I10 Essential (primary) hypertension: Secondary | ICD-10-CM | POA: Diagnosis not present

## 2017-10-05 DIAGNOSIS — G603 Idiopathic progressive neuropathy: Secondary | ICD-10-CM | POA: Diagnosis not present

## 2017-10-05 DIAGNOSIS — G5601 Carpal tunnel syndrome, right upper limb: Secondary | ICD-10-CM | POA: Diagnosis not present

## 2017-10-13 DIAGNOSIS — M79631 Pain in right forearm: Secondary | ICD-10-CM | POA: Diagnosis not present

## 2017-10-13 DIAGNOSIS — M25541 Pain in joints of right hand: Secondary | ICD-10-CM | POA: Diagnosis not present

## 2018-01-09 ENCOUNTER — Other Ambulatory Visit: Payer: Self-pay | Admitting: Adult Health

## 2018-01-09 DIAGNOSIS — Z1231 Encounter for screening mammogram for malignant neoplasm of breast: Secondary | ICD-10-CM

## 2018-01-11 ENCOUNTER — Ambulatory Visit (HOSPITAL_COMMUNITY)
Admission: RE | Admit: 2018-01-11 | Discharge: 2018-01-11 | Disposition: A | Discharge status: Home / Self Care | Payer: Medicare HMO | Source: Ambulatory Visit | Attending: Adult Health | Admitting: Adult Health

## 2018-01-11 DIAGNOSIS — Z1231 Encounter for screening mammogram for malignant neoplasm of breast: Secondary | ICD-10-CM | POA: Diagnosis not present

## 2018-04-28 DIAGNOSIS — Z6833 Body mass index (BMI) 33.0-33.9, adult: Secondary | ICD-10-CM | POA: Diagnosis not present

## 2018-04-28 DIAGNOSIS — E782 Mixed hyperlipidemia: Secondary | ICD-10-CM | POA: Diagnosis not present

## 2018-04-28 DIAGNOSIS — I10 Essential (primary) hypertension: Secondary | ICD-10-CM | POA: Diagnosis not present

## 2018-04-28 DIAGNOSIS — R69 Illness, unspecified: Secondary | ICD-10-CM | POA: Diagnosis not present

## 2018-04-28 DIAGNOSIS — M130 Polyarthritis, unspecified: Secondary | ICD-10-CM | POA: Diagnosis not present

## 2018-05-03 ENCOUNTER — Other Ambulatory Visit (HOSPITAL_COMMUNITY)
Admission: RE | Admit: 2018-05-03 | Discharge: 2018-05-03 | Disposition: A | Discharge status: Home / Self Care | Payer: Medicare HMO | Source: Ambulatory Visit | Attending: Adult Health | Admitting: Adult Health

## 2018-05-03 ENCOUNTER — Encounter: Payer: Self-pay | Admitting: Adult Health

## 2018-05-03 ENCOUNTER — Ambulatory Visit (INDEPENDENT_AMBULATORY_CARE_PROVIDER_SITE_OTHER): Payer: Medicare HMO | Admitting: Adult Health

## 2018-05-03 VITALS — BP 143/71 | HR 65 | Ht 64.0 in | Wt 184.0 lb

## 2018-05-03 DIAGNOSIS — Z124 Encounter for screening for malignant neoplasm of cervix: Secondary | ICD-10-CM | POA: Insufficient documentation

## 2018-05-03 DIAGNOSIS — Z1212 Encounter for screening for malignant neoplasm of rectum: Secondary | ICD-10-CM | POA: Diagnosis not present

## 2018-05-03 DIAGNOSIS — Z1211 Encounter for screening for malignant neoplasm of colon: Secondary | ICD-10-CM

## 2018-05-03 DIAGNOSIS — Z01419 Encounter for gynecological examination (general) (routine) without abnormal findings: Secondary | ICD-10-CM

## 2018-05-03 LAB — HEMOCCULT GUIAC POC 1CARD (OFFICE): FECAL OCCULT BLD: NEGATIVE

## 2018-05-03 MED ORDER — HYDROXYZINE HCL 10 MG PO TABS: 10.0000 mg | ORAL_TABLET | Freq: Three times a day (TID) | ORAL | 0 refills | Status: DC | PRN

## 2018-05-03 NOTE — Progress Notes (Signed)
Patient ID: Pamela Walter, female   DOB: 10-18-1950, 67 y.o.   MRN: [id number] History of Present Illness: Pamela Walter is a 67 year old black female, widowed, PM in for well woman gyn exam and pap. PCP is Dr Criss Rosales.    Current Medications, Allergies, Past Medical History, Past Surgical History, Family History and Social History were reviewed in [employer] record.     Review of Systems:  Patient denies any headaches, hearing loss, fatigue, blurred vision, shortness of breath, chest pain, abdominal pain, problems with bowel movements(she says poop stinks)(but eats lots of garlic and vegetables), urination, or intercourse(not having sex). No joint pain or mood swings.   Physical Exam:BP (!) 143/71 (BP Location: Left Arm, Patient Position: Sitting, Cuff Size: Normal)   Pulse 65   Ht 5\' 4"  (1.626 m)   Wt 184 lb (83.5 kg)   BMI 31.58 kg/m  General:  Well developed, well nourished, no acute distress Skin:  Warm and dry Neck:  Midline trachea, normal thyroid, good ROM, no lymphadenopathy,no carotid bruits heard Lungs; Clear to auscultation bilaterally Breast:  No dominant palpable mass, retraction, or nipple discharge Cardiovascular: Regular rate and rhythm Abdomen:  Soft, non tender, no hepatosplenomegaly Pelvic:  External genitalia is normal in appearance, no lesions.  The vagina is pale with loss of moisture and rugae. Urethra has no lesions or masses. The cervix is smooth, pap with HPV performed.  Uterus is felt to be normal size, shape, and contour.  No adnexal masses or tenderness noted.Bladder is non tender, no masses felt. Rectal: Good sphincter tone, no polyps, or hemorrhoids felt.  Hemoccult negative. Extremities/musculoskeletal:  No swelling or varicosities noted, no clubbing or cyanosis Psych:  No mood changes, alert and cooperative,seems happy PHQ 9 score 7,denies being suicidal, but says grand kids gets on her nervous.will try vistaril, to see if helps   Examination chaperoned by Levy Pupa LPN.  Impression: 1. Encounter for gynecological examination with Papanicolaou smear of cervix   2. Routine cervical smear   3. Screening for colorectal cancer       Plan: Physical in 2 years  Pap in 3 if normal Labs with PCP Mammogram yearly Colonoscopy per GI Meds ordered this encounter  Medications  . hydrOXYzine (ATARAX/VISTARIL) 10 MG tablet    Sig: Take 1 tablet (10 mg total) by mouth 3 (three) times daily as needed.    Dispense:  30 tablet    Refill:  0    Order Specific Question:   Supervising Provider    Answer:   Tania Ade H [[id number]]

## 2018-05-04 LAB — CYTOLOGY - PAP
Diagnosis: NEGATIVE
HPV: NOT DETECTED

## 2018-05-08 ENCOUNTER — Telehealth: Payer: Self-pay | Admitting: Adult Health

## 2018-05-08 MED ORDER — BUSPIRONE HCL 5 MG PO TABS: 5.0000 mg | ORAL_TABLET | Freq: Two times a day (BID) | ORAL | 3 refills | Status: DC

## 2018-05-08 NOTE — Telephone Encounter (Signed)
Insurance will not cover vistaril will rx buspar

## 2018-05-29 DIAGNOSIS — I10 Essential (primary) hypertension: Secondary | ICD-10-CM | POA: Diagnosis not present

## 2018-05-29 DIAGNOSIS — J309 Allergic rhinitis, unspecified: Secondary | ICD-10-CM | POA: Diagnosis not present

## 2018-05-29 DIAGNOSIS — R269 Unspecified abnormalities of gait and mobility: Secondary | ICD-10-CM | POA: Diagnosis not present

## 2018-05-29 DIAGNOSIS — Z6831 Body mass index (BMI) 31.0-31.9, adult: Secondary | ICD-10-CM | POA: Diagnosis not present

## 2018-05-29 DIAGNOSIS — E669 Obesity, unspecified: Secondary | ICD-10-CM | POA: Diagnosis not present

## 2018-05-29 DIAGNOSIS — I4891 Unspecified atrial fibrillation: Secondary | ICD-10-CM | POA: Diagnosis not present

## 2018-05-29 DIAGNOSIS — H269 Unspecified cataract: Secondary | ICD-10-CM | POA: Diagnosis not present

## 2018-05-29 DIAGNOSIS — M1990 Unspecified osteoarthritis, unspecified site: Secondary | ICD-10-CM | POA: Diagnosis not present

## 2018-05-29 DIAGNOSIS — H547 Unspecified visual loss: Secondary | ICD-10-CM | POA: Diagnosis not present

## 2018-05-29 DIAGNOSIS — G8929 Other chronic pain: Secondary | ICD-10-CM | POA: Diagnosis not present

## 2018-06-02 ENCOUNTER — Other Ambulatory Visit: Payer: Self-pay | Admitting: Adult Health

## 2018-08-28 DIAGNOSIS — I10 Essential (primary) hypertension: Secondary | ICD-10-CM | POA: Diagnosis not present

## 2018-08-28 DIAGNOSIS — Z6834 Body mass index (BMI) 34.0-34.9, adult: Secondary | ICD-10-CM | POA: Diagnosis not present

## 2018-08-28 DIAGNOSIS — K219 Gastro-esophageal reflux disease without esophagitis: Secondary | ICD-10-CM | POA: Diagnosis not present

## 2018-09-26 DIAGNOSIS — I10 Essential (primary) hypertension: Secondary | ICD-10-CM | POA: Diagnosis not present

## 2018-09-26 DIAGNOSIS — E785 Hyperlipidemia, unspecified: Secondary | ICD-10-CM | POA: Diagnosis not present

## 2018-11-26 DIAGNOSIS — I10 Essential (primary) hypertension: Secondary | ICD-10-CM | POA: Diagnosis not present

## 2018-12-27 DIAGNOSIS — R000 Tachycardia, unspecified: Secondary | ICD-10-CM | POA: Diagnosis not present

## 2018-12-27 DIAGNOSIS — E782 Mixed hyperlipidemia: Secondary | ICD-10-CM | POA: Diagnosis not present

## 2018-12-27 DIAGNOSIS — Z0000 Encounter for general adult medical examination without abnormal findings: Secondary | ICD-10-CM | POA: Diagnosis not present

## 2018-12-27 DIAGNOSIS — R252 Cramp and spasm: Secondary | ICD-10-CM | POA: Diagnosis not present

## 2018-12-27 DIAGNOSIS — I10 Essential (primary) hypertension: Secondary | ICD-10-CM | POA: Diagnosis not present

## 2019-03-06 ENCOUNTER — Other Ambulatory Visit (HOSPITAL_COMMUNITY): Payer: Self-pay | Admitting: Adult Health

## 2019-03-06 DIAGNOSIS — Z1231 Encounter for screening mammogram for malignant neoplasm of breast: Secondary | ICD-10-CM

## 2019-03-11 ENCOUNTER — Other Ambulatory Visit: Payer: Self-pay

## 2019-03-11 ENCOUNTER — Encounter (HOSPITAL_COMMUNITY): Payer: Self-pay

## 2019-03-11 ENCOUNTER — Ambulatory Visit (HOSPITAL_COMMUNITY)
Admission: RE | Admit: 2019-03-11 | Discharge: 2019-03-11 | Disposition: A | Discharge status: Home / Self Care | Payer: Medicare HMO | Source: Ambulatory Visit | Attending: Adult Health | Admitting: Adult Health

## 2019-03-11 DIAGNOSIS — Z1231 Encounter for screening mammogram for malignant neoplasm of breast: Secondary | ICD-10-CM | POA: Insufficient documentation

## 2019-04-29 DIAGNOSIS — E785 Hyperlipidemia, unspecified: Secondary | ICD-10-CM | POA: Diagnosis not present

## 2019-04-29 DIAGNOSIS — I10 Essential (primary) hypertension: Secondary | ICD-10-CM | POA: Diagnosis not present

## 2019-04-29 DIAGNOSIS — E559 Vitamin D deficiency, unspecified: Secondary | ICD-10-CM | POA: Diagnosis not present

## 2019-04-29 DIAGNOSIS — M130 Polyarthritis, unspecified: Secondary | ICD-10-CM | POA: Diagnosis not present

## 2019-05-16 DIAGNOSIS — R69 Illness, unspecified: Secondary | ICD-10-CM | POA: Diagnosis not present

## 2019-07-05 DIAGNOSIS — G8929 Other chronic pain: Secondary | ICD-10-CM | POA: Diagnosis not present

## 2019-07-05 DIAGNOSIS — E669 Obesity, unspecified: Secondary | ICD-10-CM | POA: Diagnosis not present

## 2019-07-05 DIAGNOSIS — I10 Essential (primary) hypertension: Secondary | ICD-10-CM | POA: Diagnosis not present

## 2019-07-05 DIAGNOSIS — D6869 Other thrombophilia: Secondary | ICD-10-CM | POA: Diagnosis not present

## 2019-07-05 DIAGNOSIS — M1990 Unspecified osteoarthritis, unspecified site: Secondary | ICD-10-CM | POA: Diagnosis not present

## 2019-07-05 DIAGNOSIS — I4891 Unspecified atrial fibrillation: Secondary | ICD-10-CM | POA: Diagnosis not present

## 2019-07-05 DIAGNOSIS — Z809 Family history of malignant neoplasm, unspecified: Secondary | ICD-10-CM | POA: Diagnosis not present

## 2019-07-05 DIAGNOSIS — K219 Gastro-esophageal reflux disease without esophagitis: Secondary | ICD-10-CM | POA: Diagnosis not present

## 2019-07-05 DIAGNOSIS — Z791 Long term (current) use of non-steroidal anti-inflammatories (NSAID): Secondary | ICD-10-CM | POA: Diagnosis not present

## 2019-07-05 DIAGNOSIS — Z7982 Long term (current) use of aspirin: Secondary | ICD-10-CM | POA: Diagnosis not present

## 2019-08-30 DIAGNOSIS — I10 Essential (primary) hypertension: Secondary | ICD-10-CM | POA: Diagnosis not present

## 2019-08-30 DIAGNOSIS — M130 Polyarthritis, unspecified: Secondary | ICD-10-CM | POA: Diagnosis not present

## 2019-08-30 DIAGNOSIS — E785 Hyperlipidemia, unspecified: Secondary | ICD-10-CM | POA: Diagnosis not present

## 2019-08-31 ENCOUNTER — Other Ambulatory Visit: Payer: Self-pay

## 2019-08-31 ENCOUNTER — Ambulatory Visit: Payer: Medicare HMO | Attending: Internal Medicine

## 2019-08-31 DIAGNOSIS — Z23 Encounter for immunization: Secondary | ICD-10-CM

## 2019-08-31 NOTE — Progress Notes (Signed)
   Covid-19 Vaccination Clinic  Name:  Pamela Walter    MRN: [id number] DOB: 1950-11-17  08/31/2019  [name] was observed post Covid-19 immunization for 15 minutes without incidence. She was provided with Vaccine Information Sheet and instruction to access the V-Safe system.   [name] was instructed to call 911 with any severe reactions post vaccine: Marland Kitchen Difficulty breathing  . Swelling of your face and throat  . A fast heartbeat  . A bad rash all over your body  . Dizziness and weakness    Immunizations Administered    Name Date Dose VIS Date Route   Moderna COVID-19 Vaccine 08/31/2019 11:24 AM 0.5 mL 06/18/2019 Intramuscular   Manufacturer: Moderna   Lot: [id number]   [id number]

## 2019-09-28 ENCOUNTER — Ambulatory Visit: Payer: Medicare HMO | Attending: Internal Medicine

## 2019-09-28 DIAGNOSIS — Z23 Encounter for immunization: Secondary | ICD-10-CM

## 2019-09-28 NOTE — Progress Notes (Signed)
   Covid-19 Vaccination Clinic  Name:  ELAISHA BEESON    MRN: [id number] DOB: 04-23-1951  09/28/2019  Ms. Gharibian was observed post Covid-19 immunization for 15 minutes without incident. She was provided with Vaccine Information Sheet and instruction to access the V-Safe system.   Ms. Atherholt was instructed to call 911 with any severe reactions post vaccine: Marland Kitchen Difficulty breathing  . Swelling of face and throat  . A fast heartbeat  . A bad rash all over body  . Dizziness and weakness   Immunizations Administered    Name Date Dose VIS Date Route   Moderna COVID-19 Vaccine 09/28/2019 10:27 AM 0.5 mL 06/18/2019 Intramuscular   Manufacturer: Moderna   Lot: [id number]   [id number]

## 2019-11-15 ENCOUNTER — Emergency Department (HOSPITAL_COMMUNITY): Payer: Medicare HMO

## 2019-11-15 ENCOUNTER — Encounter (HOSPITAL_COMMUNITY): Payer: Self-pay | Admitting: Emergency Medicine

## 2019-11-15 ENCOUNTER — Emergency Department (HOSPITAL_COMMUNITY)
Admission: EM | Admit: 2019-11-15 | Discharge: 2019-11-15 | Disposition: A | Discharge status: Home / Self Care | Payer: Medicare HMO | Attending: Emergency Medicine | Admitting: Emergency Medicine

## 2019-11-15 ENCOUNTER — Other Ambulatory Visit: Payer: Self-pay

## 2019-11-15 DIAGNOSIS — M1990 Unspecified osteoarthritis, unspecified site: Secondary | ICD-10-CM | POA: Insufficient documentation

## 2019-11-15 DIAGNOSIS — M47816 Spondylosis without myelopathy or radiculopathy, lumbar region: Secondary | ICD-10-CM | POA: Diagnosis not present

## 2019-11-15 DIAGNOSIS — M5417 Radiculopathy, lumbosacral region: Secondary | ICD-10-CM | POA: Diagnosis not present

## 2019-11-15 DIAGNOSIS — M545 Low back pain: Secondary | ICD-10-CM | POA: Diagnosis present

## 2019-11-15 DIAGNOSIS — N3001 Acute cystitis with hematuria: Secondary | ICD-10-CM

## 2019-11-15 LAB — URINALYSIS, ROUTINE W REFLEX MICROSCOPIC
Bilirubin Urine: NEGATIVE
Glucose, UA: NEGATIVE mg/dL
Hgb urine dipstick: NEGATIVE
Ketones, ur: NEGATIVE mg/dL
Nitrite: NEGATIVE
Protein, ur: NEGATIVE mg/dL
Specific Gravity, Urine: 1.015 (ref 1.005–1.030)
WBC, UA: >50 WBC/hpf — ABNORMAL HIGH (ref 0–5)
pH: 5 (ref 5.0–8.0)

## 2019-11-15 MED ORDER — HYDROCODONE-ACETAMINOPHEN 5-325 MG PO TABS
1.0000 | ORAL_TABLET | Freq: Once | ORAL | Status: AC
  Administered 2019-11-15: 06:00:00 1 via ORAL
  Filled 2019-11-15: qty 1

## 2019-11-15 MED ORDER — HYDROCODONE-ACETAMINOPHEN 5-325 MG PO TABS
1.0000 | ORAL_TABLET | Freq: Once | ORAL | Status: AC
  Administered 2019-11-15: 1 via ORAL
  Filled 2019-11-15: qty 1

## 2019-11-15 MED ORDER — PREDNISONE 50 MG PO TABS: 50.0000 mg | ORAL_TABLET | Freq: Every day | ORAL | 0 refills | Status: DC

## 2019-11-15 MED ORDER — HYDROCODONE-ACETAMINOPHEN 5-325 MG PO TABS: 1.0000 | ORAL_TABLET | Freq: Four times a day (QID) | ORAL | 0 refills | Status: DC | PRN

## 2019-11-15 MED ORDER — PREDNISONE 50 MG PO TABS
60.0000 mg | ORAL_TABLET | Freq: Once | ORAL | Status: AC
  Administered 2019-11-15: 06:00:00 60 mg via ORAL
  Filled 2019-11-15: qty 1

## 2019-11-15 MED ORDER — CEPHALEXIN 500 MG PO CAPS: 500.0000 mg | ORAL_CAPSULE | Freq: Two times a day (BID) | ORAL | 0 refills | Status: DC

## 2019-11-15 NOTE — ED Triage Notes (Signed)
Pt c/o of right side back pain that goes down the back of her leg.

## 2019-11-15 NOTE — ED Provider Notes (Signed)
[facility] EMERGENCY DEPARTMENT Provider Note   CSN: [id number] Arrival date & time: 11/15/19  0413     History Chief Complaint  Patient presents with   Sciatica    Pamela Walter is a 69 y.o. female.  The history is provided by the patient.  Back Pain Location:  Lumbar spine Quality:  Aching Radiates to:  R thigh Pain severity:  Moderate Onset quality:  Gradual Duration:  1 day Timing:  Constant Progression:  Worsening Chronicity:  New Relieved by:  Nothing Worsened by:  Ambulation Associated symptoms: leg pain   Associated symptoms: no abdominal pain, no bladder incontinence, no bowel incontinence, no chest pain, no dysuria, no fever, no numbness, no tingling and no weakness   Risk factors: no hx of cancer   Patient reports in the past 24 hours she has had back pain that radiates to her right leg.  No trauma.  No history of back surgery.  No incontinence.  No leg weakness.  She is still ambulatory.  No abdominal pain.  No dysuria. She does not recall having this before     Past Medical History:  Diagnosis Date   Arthritis    Hyperlipidemia    borderline   Hypertension    Irregular heart rate    Vaginal discharge 06/05/2014    Patient Active Problem List   Diagnosis Date Noted   H/O calcium pyrophosphate deposition disease (CPPD) 07/14/2016   Vaginal discharge 06/05/2014    Past Surgical History:  Procedure Laterality Date   CHOLECYSTECTOMY     30 years ago   COLONOSCOPY  03/01/2011   Procedure: COLONOSCOPY;  Surgeon: Dorothyann Peng, MD;  [facility];  Service: Endoscopy;  Laterality: N/A;   TEMPOROMANDIBULAR JOINT SURGERY  1993     OB History    Gravida  4   Para  2   Term      Preterm      AB  2   Living  2     SAB  2   TAB      Ectopic      Multiple      Live Births              Family History  Problem Relation Age of Onset   Hypertension Mother    Diabetes Mother    Hypertension Father      Hypertension Sister    Cancer Brother        prostate   Diabetes Son    Diabetes Brother    Dementia Brother     Social History   Tobacco Use   Smoking status: Former Smoker    Types: Cigarettes   Smokeless tobacco: Never Used  Substance Use Topics   Alcohol use: Yes    Comment: wine once a month   Drug use: No    Home Medications Prior to Admission medications   Medication Sig Start Date End Date Taking? Authorizing Provider  amLODipine (NORVASC) 5 MG tablet Take 5 mg by mouth daily.  04/21/16   [provider]  busPIRone (BUSPAR) 5 MG tablet TAKE 1 TABLET BY MOUTH TWICE A DAY 06/04/18   Roma Schanz, CNM  metoprolol succinate (TOPROL-XL) 25 MG 24 hr tablet TAKE ONE TABLET BY MOUTH EVERY DAY 11/22/12   Jonnie Kind, MD  naproxen (NAPROSYN) 500 MG tablet Take 500 mg by mouth 2 (two) times daily with a meal.  04/21/16   [provider]  Allergies    Patient has no known allergies.  Review of Systems   Review of Systems  Constitutional: Negative for fever.  Respiratory: Negative for shortness of breath.   Cardiovascular: Negative for chest pain.  Gastrointestinal: Negative for abdominal pain and bowel incontinence.  Genitourinary: Negative for bladder incontinence, difficulty urinating and dysuria.  Musculoskeletal: Positive for back pain.  Neurological: Negative for tingling, weakness and numbness.  All other systems reviewed and are negative.   Physical Exam Updated Vital Signs BP (!) 150/62 (BP Location: Right Arm)    Pulse 85    Temp 98 F (36.7 C) (Oral)    Resp 18    Ht 1.626 m (5\' 4" )    Wt 82.6 kg    SpO2 99%    BMI 31.24 kg/m   Physical Exam CONSTITUTIONAL: Well developed/well nourished, appears younger than stated age HEAD: Normocephalic/atraumatic EYES: EOMI/PERRL NECK: supple no meningeal signs SPINE/BACK:entire spine nontender right-sided lumbar paraspinal tenderness No bruising/crepitance/stepoffs noted to  spine CV: S1/S2 noted, no murmurs/rubs/gallops noted LUNGS: Lungs are clear to auscultation bilaterally, no apparent distress ABDOMEN: soft, nontender, no rebound or guarding GU:no cva tenderness NEURO: Awake/alert,equal motor 5/5 strength noted with the following: hip flexion/knee flexion/extension, foot dorsi/plantar flexion, great toe extension intact bilaterally, no clonus bilaterally, plantar reflex appropriate (toes downgoing), no sensory deficit in any dermatome.  Equal patellar/achilles reflex noted (2+) in bilateral lower extremities.  Pt is able to ambulate unassisted. EXTREMITIES: pulses normal/equal in the feet, full ROM, no edema or tenderness to the right leg SKIN: warm, color normal PSYCH: no abnormalities of mood noted, alert and oriented to situation   ED Results / Procedures / Treatments   Labs (all labs ordered are listed, but only abnormal results are displayed) Labs Reviewed  URINALYSIS, ROUTINE W REFLEX MICROSCOPIC - Abnormal; Notable for the following components:      Result Value   Color, Urine AMBER (*)    APPearance CLOUDY (*)    Leukocytes,Ua LARGE (*)    WBC, UA >50 (*)    Bacteria, UA RARE (*)    Non Squamous Epithelial 0-5 (*)    All other components within normal limits  URINE CULTURE    EKG None  Radiology DG Lumbar Spine Complete  Result Date: 11/15/2019 CLINICAL DATA:  Pain radiating into the right hip EXAM: LUMBAR SPINE - COMPLETE 4+ VIEW COMPARISON:  None. FINDINGS: There is a mild dextroconvex scoliotic curvature of the lumbar spine. Mild to moderate disc height loss with facet arthrosis most notable at L4-L5 and L5-S1. No fracture or malalignment is seen. There is surgical clips in the right lower quadrant. IMPRESSION: No acute fracture or malalignment. Mild dextroconvex scoliotic curvature. Mild to moderate lumbar spine spondylosis most notable at L4-L5 and L5-S1. Electronically Signed   By: Prudencio Pair M.D.   On: 11/15/2019 05:41     Procedures Procedures   Medications Ordered in ED Medications  HYDROcodone-acetaminophen (NORCO/VICODIN) 5-325 MG per tablet 1 tablet (has no administration in time range)  predniSONE (DELTASONE) tablet 60 mg (has no administration in time range)  HYDROcodone-acetaminophen (NORCO/VICODIN) 5-325 MG per tablet 1 tablet (1 tablet Oral Given 11/15/19 0459)    ED Course  I have reviewed the triage vital signs and the nursing notes.  Pertinent labs & imaging results that were available during my care of the patient were reviewed by me and considered in my medical decision making (see chart for details).    MDM Rules/Calculators/A&P  Patient presents for back pain that radiates to her left leg.  She has no neuro deficits and she can ambulate. Strong suspicion for lumbosacral radiculopathy.  X-rays negative for acute fracture.  She has no red flags to warrant emergent MRI.  Plan will be to treat pain, limit heavy lifting, and will give a short burst of steroids.  She will need to see her PCP in 1 to 2 weeks.  Also noted to have evidence of potential UTI.  Urine culture sent.  Will start on Keflex twice daily for 1 week She denies any dysuria or symptoms of a yeast infection.  I advised that she will need to have a repeat urinalysis after taking the antibiotics.  We discussed strict return precautions Final Clinical Impression(s) / ED Diagnoses Final diagnoses:  Lumbosacral radiculopathy  Acute cystitis with hematuria    Rx / DC Orders ED Discharge Orders         Ordered    cephALEXin (KEFLEX) 500 MG capsule  2 times daily     11/15/19 0557    predniSONE (DELTASONE) 50 MG tablet  Daily with breakfast     11/15/19 0557    HYDROcodone-acetaminophen (NORCO/VICODIN) 5-325 MG tablet  Every 6 hours PRN     11/15/19 0604           Ripley Fraise, MD 11/15/19 [phone]

## 2019-11-15 NOTE — Discharge Instructions (Addendum)
For your urine infection, take the Keflex twice a day for 7 days.  Be sure to have Dr. Criss Rosales recheck your urine in 1 to 2 weeks  For your back pain, you could take the Vicodin as needed for pain, but you cannot drive while taking it.  Please take the prednisone once a day every morning to help with the swelling and inflammation in your back.  Be sure to follow-up with Dr. Criss Rosales in the next 1 to 2 weeks if your pain is not improving See below for ER return instructions  SEEK IMMEDIATE MEDICAL ATTENTION IF: New numbness, tingling, weakness, or problem with the use of your arms or legs.  Severe back pain not relieved with medications.  Change in bowel or bladder control (if you lose control of stool or urine, or if you are unable to urinate) Increasing pain in any areas of the body (such as chest or abdominal pain).  Shortness of breath, dizziness or fainting.  Nausea (feeling sick to your stomach), vomiting, fever, or sweats.

## 2019-11-15 NOTE — ED Notes (Signed)
Pt ambulatory to room from triage.  

## 2019-11-16 LAB — URINE CULTURE: Culture: >=100000 — AB

## 2019-11-27 DIAGNOSIS — I10 Essential (primary) hypertension: Secondary | ICD-10-CM | POA: Diagnosis not present

## 2019-11-27 DIAGNOSIS — N329 Bladder disorder, unspecified: Secondary | ICD-10-CM | POA: Diagnosis not present

## 2019-11-27 DIAGNOSIS — M130 Polyarthritis, unspecified: Secondary | ICD-10-CM | POA: Diagnosis not present

## 2019-11-27 DIAGNOSIS — E782 Mixed hyperlipidemia: Secondary | ICD-10-CM | POA: Diagnosis not present

## 2019-12-11 ENCOUNTER — Encounter: Payer: Self-pay | Admitting: Orthopaedic Surgery

## 2019-12-11 ENCOUNTER — Other Ambulatory Visit: Payer: Self-pay

## 2019-12-11 ENCOUNTER — Ambulatory Visit (INDEPENDENT_AMBULATORY_CARE_PROVIDER_SITE_OTHER): Payer: Medicare HMO | Admitting: Orthopaedic Surgery

## 2019-12-11 VITALS — BP 142/68 | HR 69 | Ht 64.0 in | Wt 189.0 lb

## 2019-12-11 DIAGNOSIS — M545 Low back pain, unspecified: Secondary | ICD-10-CM

## 2019-12-11 MED ORDER — HYDROCODONE-ACETAMINOPHEN 5-325 MG PO TABS: ORAL_TABLET | ORAL | 0 refills | Status: DC

## 2019-12-11 MED ORDER — NAPROXEN 500 MG PO TABS: 500.0000 mg | ORAL_TABLET | Freq: Two times a day (BID) | ORAL | 5 refills | Status: DC

## 2019-12-11 NOTE — Progress Notes (Signed)
Subjective:    Patient ID: Pamela Walter, female    DOB: 01-Oct-1950, 69 y.o.   MRN: [id number]  HPI She has lower back pain with right sided sciatica that has been getting worse over the last month.  She was seen at [place] 11-15-2019 for this.  She had X-rays which showed: IMPRESSION: No acute fracture or malalignment.  Mild dextroconvex scoliotic curvature.  Mild to moderate lumbar spine spondylosis most notable at L4-L5 and L5-S1.  I have independently reviewed and interpreted x-rays of this patient done at another site by another physician or qualified health professional.  I have reviewed the ER notes.  She has been seen by her family doctor who asked that she see orthopedist.  She has no trauma.  She has pain that radiates from the lower back to the right hip, posterior right thigh, past the knee to the lateral and dorsum of the right foot.  She has not been on NSAIDs.  She has tried heat and ice which help only slightly.  She has no weakness.   Review of Systems  Constitutional: Positive for activity change.  Cardiovascular: Positive for palpitations.  Musculoskeletal: Positive for arthralgias, back pain and myalgias.  All other systems reviewed and are negative.  For Review of Systems, all other systems reviewed and are negative.  The following is a summary of the past history medically, past history surgically, known current medicines, social history and family history.  This information is gathered electronically by the computer from prior information and documentation.  I review this each visit and have found including this information at this point in the chart is beneficial and informative.   Past Medical History:  Diagnosis Date  . Arthritis   . Hyperlipidemia    borderline  . Hypertension   . Irregular heart rate   . Vaginal discharge 06/05/2014    Past Surgical History:  Procedure Laterality Date  . CHOLECYSTECTOMY     30 years ago  .  COLONOSCOPY  03/01/2011   Procedure: COLONOSCOPY;  Surgeon: Dorothyann Peng, MD;  Location[facility];  Service: Endoscopy;  Laterality: N/A;  . TEMPOROMANDIBULAR JOINT SURGERY  1993    Current Outpatient Medications on File Prior to Visit  Medication Sig Dispense Refill  . amLODipine (NORVASC) 5 MG tablet Take 5 mg by mouth daily.     . busPIRone (BUSPAR) 5 MG tablet TAKE 1 TABLET BY MOUTH TWICE A DAY 180 tablet 0  . cephALEXin (KEFLEX) 500 MG capsule Take 1 capsule (500 mg total) by mouth 2 (two) times daily. 2 caps po bid x 7 days 14 capsule 0  . metoprolol succinate (TOPROL-XL) 25 MG 24 hr tablet TAKE ONE TABLET BY MOUTH EVERY DAY 30 tablet 6  . predniSONE (DELTASONE) 50 MG tablet Take 1 tablet (50 mg total) by mouth daily with breakfast. 4 tablet 0   No current facility-administered medications on file prior to visit.    Social History   Socioeconomic History  . Marital status: Married    Spouse name: Not on file  . Number of children: Not on file  . Years of education: Not on file  . Highest education level: Not on file  Occupational History  . Not on file  Tobacco Use  . Smoking status: Former Smoker    Types: Cigarettes  . Smokeless tobacco: Never Used  Substance and Sexual Activity  . Alcohol use: Yes    Comment: wine once a month  .  Drug use: No  . Sexual activity: Not Currently    Birth control/protection: Post-menopausal  Other Topics Concern  . Not on file  Social History Narrative  . Not on file   Social Determinants of Health   Financial Resource Strain:   . Difficulty of Paying Living Expenses:   Food Insecurity:   . Worried About Charity fundraiser in the Last Year:   . Arboriculturist in the Last Year:   Transportation Needs:   . Film/video editor (Medical):   Marland Kitchen Lack of Transportation (Non-Medical):   Physical Activity:   . Days of Exercise per Week:   . Minutes of Exercise per Session:   Stress:   . Feeling of Stress :   Social  Connections:   . Frequency of Communication with Friends and Family:   . Frequency of Social Gatherings with Friends and Family:   . Attends Religious Services:   . Active Member of Clubs or Organizations:   . Attends Archivist Meetings:   Marland Kitchen Marital Status:   Intimate Partner Violence:   . Fear of Current or Ex-Partner:   . Emotionally Abused:   Marland Kitchen Physically Abused:   . Sexually Abused:     Family History  Problem Relation Age of Onset  . Hypertension Mother   . Diabetes Mother   . Hypertension Father   . Hypertension Sister   . Cancer Brother        prostate  . Diabetes Son   . Diabetes Brother   . Dementia Brother     BP (!) 142/68   Pulse 69   Ht 5\' 4"  (1.626 m)   Wt 189 lb (85.7 kg)   BMI 32.44 kg/m   Body mass index is 32.44 kg/m.     Objective:   Physical Exam Vitals and nursing note reviewed.  Constitutional:      Appearance: She is well-developed.  HENT:     Head: Normocephalic and atraumatic.  Eyes:     Conjunctiva/sclera: Conjunctivae normal.     Pupils: Pupils are equal, round, and reactive to light.  Cardiovascular:     Rate and Rhythm: Normal rate and regular rhythm.  Pulmonary:     Effort: Pulmonary effort is normal.  Abdominal:     Palpations: Abdomen is soft.  Musculoskeletal:       Arms:     Cervical back: Normal range of motion and neck supple.  Skin:    General: Skin is warm and dry.  Neurological:     Mental Status: She is alert and oriented to person, place, and time.     Cranial Nerves: No cranial nerve deficit.     Motor: No abnormal muscle tone.     Coordination: Coordination normal.     Deep Tendon Reflexes: Reflexes are normal and symmetric. Reflexes normal.  Psychiatric:        Behavior: Behavior normal.        Thought Content: Thought content normal.        Judgment: Judgment normal.           Assessment & Plan:   Encounter Diagnosis  Name Primary?  . Lumbar pain with radiation down right leg Yes     I will begin PT.  I will order Naprosyn 500 po bid pc,  I will call in pain medicine.  Return in two weeks.  She might need MRI.  Call if any problem.  Precautions discussed.  I have reviewed the  [place] Controlled Substance Reporting System web site prior to prescribing narcotic medicine for this patient.   Electronically Signed Sanjuana Kava, MD 5/26/202110:41 AM

## 2019-12-25 ENCOUNTER — Ambulatory Visit: Payer: Medicare HMO | Admitting: Orthopaedic Surgery

## 2019-12-26 ENCOUNTER — Ambulatory Visit (HOSPITAL_COMMUNITY): Payer: Medicare HMO | Attending: Orthopaedic Surgery | Admitting: Physical Therapy

## 2019-12-26 ENCOUNTER — Encounter (HOSPITAL_COMMUNITY): Payer: Self-pay | Admitting: Physical Therapy

## 2019-12-26 ENCOUNTER — Other Ambulatory Visit: Payer: Self-pay

## 2019-12-26 DIAGNOSIS — R2689 Other abnormalities of gait and mobility: Secondary | ICD-10-CM

## 2019-12-26 DIAGNOSIS — R29898 Other symptoms and signs involving the musculoskeletal system: Secondary | ICD-10-CM

## 2019-12-26 DIAGNOSIS — M545 Low back pain, unspecified: Secondary | ICD-10-CM

## 2019-12-26 DIAGNOSIS — M6281 Muscle weakness (generalized): Secondary | ICD-10-CM | POA: Diagnosis not present

## 2019-12-26 NOTE — Therapy (Signed)
[place], [place], [address] Phone: [phone]   Fax:  [phone]  Physical Therapy Evaluation  Patient Details  Name: Pamela Walter MRN: [id number] Date of Birth: [birth year] Referring Provider (PT): Sanjuana Kava MD   Encounter Date: 12/26/2019   PT End of Session - 12/26/19 1107    Visit Number 1    Number of Visits 4    Date for PT Re-Evaluation 01/23/20    Authorization Type [employer] Medicare (no visit limit, no auth required)    Progress Note Due on Visit 10    PT Start Time [phone]    PT Stop Time 1030    PT Time Calculation (min) 42 min    Activity Tolerance Patient tolerated treatment well    Behavior During Therapy [facility] for tasks assessed/performed           Past Medical History:  Diagnosis Date  . Arthritis   . Hyperlipidemia    borderline  . Hypertension   . Irregular heart rate   . Vaginal discharge 06/05/2014    Past Surgical History:  Procedure Laterality Date  . CHOLECYSTECTOMY     30 years ago  . COLONOSCOPY  03/01/2011   Procedure: COLONOSCOPY;  Surgeon: Dorothyann Peng, MD;  [facility];  Service: Endoscopy;  Laterality: N/A;  . [place]    There were no vitals filed for this visit.    Subjective Assessment - 12/26/19 0946    Subjective Patient is a 69 y.o. female who presents to physical therapy with c/o LBP which began at the end of April 2021 with insidious onset. She mostly is having pain at night which starts in her back down to the front of her shin. At first she thought it was cramping and it didn't get better so she went to the ED. Patient states it is getting a little better but she keeps having pain at night. She was given pain meds but she only takes them at night. She also is having difficulty getting up steps. Aggravating factors include standing, walking. Relieving factors include sitting with good posture. Her main goals is to  get her back better so she can exercise/walk.    Currently in Pain? Yes    Pain Score 3     Pain Location Back    Pain Orientation Right;Lower              [facility] PT Assessment - 12/26/19 0001      Assessment   Medical Diagnosis LBP with R radicular pain    Referring Provider (PT) Sanjuana Kava MD    Onset Date/Surgical Date 11/14/19    Prior Therapy None      Precautions   Precautions None      Restrictions   Weight Bearing Restrictions No      Balance Screen   Has the patient fallen in the past 6 months No    Has the patient had a decrease in activity level because of a fear of falling?  No    Is the patient reluctant to leave their home because of a fear of falling?  No      Prior Function   Level of Independence Independent    Vocation Retired    Programmer, multimedia Work      Cognition   Overall Cognitive Status Within [employer] for tasks assessed      Observation/Other Assessments   Observations Ambulates without  AD    Focus on Therapeutic Outcomes (FOTO)  37% limited      Sensation   Light Touch Impaired Detail    Additional Comments decreased L4 on R      ROM / Strength   AROM / PROM / Strength AROM;Strength      AROM   AROM Assessment Site Lumbar    Lumbar Flexion 0% limited    Lumbar Extension 0% limited    Lumbar - Right Side Bend 25% limited pain in low back    Lumbar - Left Side Bend 25% limited pain in low back on R    Lumbar - Right Rotation 0% limited, pain in low back    Lumbar - Left Rotation 0% limited      Strength   Strength Assessment Site Hip;Knee;Ankle    Right/Left Hip Right;Left    Right Hip Flexion 4-/5   pain in low back   Left Hip Flexion 4+/5    Right/Left Knee Right;Left    Right Knee Flexion 5/5    Right Knee Extension 4+/5    Left Knee Flexion 5/5    Left Knee Extension 5/5    Right/Left Ankle Right;Left    Right Ankle Dorsiflexion 5/5    Left Ankle Dorsiflexion 5/5      Palpation   Spinal mobility hypomobile  thoracic and lumbar spine, greatest tenderness L1, L3-L5    Palpation comment Tenderness lumbar paraspinals R>L      Transfers   Five time sit to stand comments  7.52 seconds    Comments no UE use      Ambulation/Gait   Ambulation/Gait Yes    Ambulation/Gait Assistance 7: Independent    Ambulation Distance (Feet) 450 Feet    Assistive device None    Gait Pattern Lateral trunk lean to right;Trendelenburg    Ambulation Surface Level;Indoor    Gait velocity decreased                      Objective measurements completed on examination: See above findings.       [facility] Adult PT Treatment/Exercise - 12/26/19 0001      Exercises   Exercises Lumbar      Lumbar Exercises: Stretches   Standing Extension 10 reps;5 seconds    Press Ups 10 reps                  PT Education - 12/26/19 0959    Education Details Patient educated on exam findings, POC, scope of PT, LBP, initial HEP, mechanics of exercise, discussed financial concerns    Person(s) Educated Patient    Methods Explanation;Demonstration;Handout    Comprehension Verbalized understanding;Returned demonstration            PT Short Term Goals - 12/26/19 1102      PT SHORT TERM GOAL #1   Title Patient will be independent with HEP in order to improve functional outcomes.    Time 2    Period Weeks    Status New    Target Date 01/09/20      PT SHORT TERM GOAL #2   Title Patient will report at least 25% improvement in symptoms for improved quality of life.    Time 2    Period Weeks    Status New    Target Date 01/09/20             PT Long Term Goals - 12/26/19 1102      PT LONG TERM  GOAL #1   Title Patient will report at least 75% improvement in symptoms for improved quality of life.    Time 4    Period Weeks    Status New    Target Date 01/23/20      PT LONG TERM GOAL #2   Title Patient will improve FOTO score by at least 5 points in order to indicate improved tolerance to activity.     Time 4    Period Weeks    Status New    Target Date 01/23/20      PT LONG TERM GOAL #3   Title Patient will be able to ambulate for at least 60 minutes with pain no greater than 1/10 in order to demonstrate improved ability to ambulate in the community.    Time 4    Period Weeks    Status New    Target Date 01/23/20                  Plan - 12/26/19 1115    Clinical Impression Statement Patient is a 69 y.o. female who presents to physical therapy with c/o LBP which began at the end of April 2021 with insidious onset. She presents with pain limited deficits in lumbar and LE strength, ROM, endurance, sensation, postural impairments, gait, spinal mobility and functional mobility with ADL. She is having to modify and restrict ADL as indicated by FOTO score as well as subjective information and objective measures which is affecting overall participation. Patient experiences slight decrease in symptoms following repeated extension in standing but has difficulty completing in prone secondary to impaired UE strength. Patient will benefit from skilled physical therapy in order to improve function and reduce impairment.    Personal Factors and Comorbidities Age;Behavior Pattern;Comorbidity 2;Finances    Comorbidities HTN, sedentary    Examination-Activity Limitations Bed Mobility;Lift;Locomotion Level;Sleep;Stairs;Transfers    Examination-Participation Restrictions Cleaning;Yard Work;Shop    Stability/Clinical Decision Making Stable/Uncomplicated    Clinical Decision Making Low    Rehab Potential Fair    PT Frequency 1x / week    PT Duration 4 weeks    PT Treatment/Interventions ADLs/Self Care Home Management;Aquatic Therapy;Biofeedback;Cryotherapy;Electrical Stimulation;Iontophoresis 4mg /ml Dexamethasone;Moist Heat;Traction;Ultrasound;DME Instruction;Gait training;Stair training;Functional mobility training;Therapeutic activities;Therapeutic exercise;Balance training;Neuromuscular  re-education;Patient/family education;Orthotic Fit/Training;Dry needling;Energy conservation;Spinal Manipulations;Joint Manipulations    PT Next Visit Plan assess respone to HEP/extension based exercises, possible manual for pain relief and mobility, hip and core strengthing    PT Home Exercise Plan 12/26/19 standing lumbar ext    Consulted and Agree with Plan of Care Patient           Patient will benefit from skilled therapeutic intervention in order to improve the following deficits and impairments:  Abnormal gait, Decreased range of motion, Difficulty walking, Decreased endurance, Decreased activity tolerance, Hypomobility, Improper body mechanics, Pain, Postural dysfunction, Decreased strength, Decreased mobility, Increased muscle spasms  Visit Diagnosis: Low back pain, unspecified back pain laterality, unspecified chronicity, unspecified whether sciatica present  Muscle weakness (generalized)  Other abnormalities of gait and mobility  Other symptoms and signs involving the musculoskeletal system     Problem List Patient Active Problem List   Diagnosis Date Noted  . H/O calcium pyrophosphate deposition disease (CPPD) 07/14/2016  . Vaginal discharge 06/05/2014    11:50 AM, 12/26/19 Mearl Latin PT, DPT Physical Therapist at [place], [place], [address] Phone: [phone]   Fax:  [phone]  Name: Pamela  NATALIEE Walter MRN: [id number] Date of Birth: Jul 17, 1951

## 2020-01-08 ENCOUNTER — Encounter: Payer: Self-pay | Admitting: Orthopaedic Surgery

## 2020-01-08 ENCOUNTER — Other Ambulatory Visit: Payer: Self-pay

## 2020-01-08 ENCOUNTER — Ambulatory Visit (INDEPENDENT_AMBULATORY_CARE_PROVIDER_SITE_OTHER): Payer: Medicare HMO | Admitting: Orthopaedic Surgery

## 2020-01-08 VITALS — BP 134/55 | HR 59 | Ht 64.0 in | Wt 181.0 lb

## 2020-01-08 DIAGNOSIS — M545 Low back pain, unspecified: Secondary | ICD-10-CM

## 2020-01-08 MED ORDER — HYDROCODONE-ACETAMINOPHEN 5-325 MG PO TABS: ORAL_TABLET | ORAL | 0 refills | Status: DC

## 2020-01-08 NOTE — Progress Notes (Signed)
Patient NT:[id number] Pamela Walter, female DOB:25-Jun-1951, 69 y.o. HQP:[id number]  Chief Complaint  Patient presents with  . Back Pain    Better/only went to 1 session of PT due to cost/was given some home exercises and has been doing them    HPI  Pamela Walter is a 69 y.o. female who has lower back pain with right sided paresthesias.  She went to PT once and got exercises to do at home. I have reviewed the PT notes.  The cost of PT is too high to go more so she is doing her exercises at home.  She is a member of the [employer] and I have suggested she go and have pool therapy there.  She will check into that.  She is taking the Naprosyn and it helps.  Overall she is improved with less pain but still has some paresthesias.  She is well motivated. I will hold off on MRI now as she is improved.   Body mass index is 31.07 kg/m.  ROS  Review of Systems  Constitutional: Positive for activity change.  Cardiovascular: Positive for palpitations.  Musculoskeletal: Positive for arthralgias, back pain and myalgias.  All other systems reviewed and are negative.   All other systems reviewed and are negative.  The following is a summary of the past history medically, past history surgically, known current medicines, social history and family history.  This information is gathered electronically by the computer from prior information and documentation.  I review this each visit and have found including this information at this point in the chart is beneficial and informative.    Past Medical History:  Diagnosis Date  . Arthritis   . Hyperlipidemia    borderline  . Hypertension   . Irregular heart rate   . Vaginal discharge 06/05/2014    Past Surgical History:  Procedure Laterality Date  . CHOLECYSTECTOMY     30 years ago  . COLONOSCOPY  03/01/2011   Procedure: COLONOSCOPY;  Surgeon: Dorothyann Peng, MD;  [facility];  Service: Endoscopy;  Laterality: N/A;  . TEMPOROMANDIBULAR JOINT  SURGERY  1993    Family History  Problem Relation Age of Onset  . Hypertension Mother   . Diabetes Mother   . Hypertension Father   . Hypertension Sister   . Cancer Brother        prostate  . Diabetes Son   . Diabetes Brother   . Dementia Brother     Social History Social History   Tobacco Use  . Smoking status: Former Smoker    Types: Cigarettes  . Smokeless tobacco: Never Used  Substance Use Topics  . Alcohol use: Yes    Comment: wine once a month  . Drug use: No    No Known Allergies  Current Outpatient Medications  Medication Sig Dispense Refill  . amLODipine (NORVASC) 5 MG tablet Take 5 mg by mouth daily.     . busPIRone (BUSPAR) 5 MG tablet TAKE 1 TABLET BY MOUTH TWICE A DAY 180 tablet 0  . cephALEXin (KEFLEX) 500 MG capsule Take 1 capsule (500 mg total) by mouth 2 (two) times daily. 2 caps po bid x 7 days 14 capsule 0  . HYDROcodone-acetaminophen (NORCO/VICODIN) 5-325 MG tablet One tablet every six hours for pain.  Limit 7 days. 28 tablet 0  . metoprolol succinate (TOPROL-XL) 25 MG 24 hr tablet TAKE ONE TABLET BY MOUTH EVERY DAY 30 tablet 6  . naproxen (NAPROSYN) 500 MG tablet Take 1 tablet (  500 mg total) by mouth 2 (two) times daily with a meal. 60 tablet 5  . predniSONE (DELTASONE) 50 MG tablet Take 1 tablet (50 mg total) by mouth daily with breakfast. 4 tablet 0   No current facility-administered medications for this visit.     Physical Exam  Blood pressure (!) 134/55, pulse (!) 59, height 5\' 4"  (1.626 m), weight 181 lb (82.1 kg).  Constitutional: overall normal hygiene, normal nutrition, well developed, normal grooming, normal body habitus. Assistive device:none  Musculoskeletal: gait and station Limp none, muscle tone and strength are normal, no tremors or atrophy is present.  .  Neurological: coordination overall normal.  Deep tendon reflex/nerve stretch intact.  Sensation normal.  Cranial nerves II-XII intact.   Skin:   Normal overall no scars,  lesions, ulcers or rashes. No psoriasis.  Psychiatric: Alert and oriented x 3.  Recent memory intact, remote memory unclear.  Normal mood and affect. Well groomed.  Good eye contact.  Cardiovascular: overall no swelling, no varicosities, no edema bilaterally, normal temperatures of the legs and arms, no clubbing, cyanosis and good capillary refill.  Lymphatic: palpation is normal.  All other systems reviewed and are negative   Spine/Pelvis examination:  Inspection:  Overall, sacoiliac joint benign and hips nontender; without crepitus or defects.   Thoracic spine inspection: Alignment normal without kyphosis present   Lumbar spine inspection:  Alignment  with normal lumbar lordosis, without scoliosis apparent.   Thoracic spine palpation:  without tenderness of spinal processes   Lumbar spine palpation: without tenderness of lumbar area; without tightness of lumbar muscles    Range of Motion:   Lumbar flexion, forward flexion is normal without pain or tenderness    Lumbar extension is full without pain or tenderness   Left lateral bend is normal without pain or tenderness   Right lateral bend is normal without pain or tenderness   Straight leg raising is normal  Strength & tone: normal   Stability overall normal stability  The patient has been educated about the nature of the problem(s) and counseled on treatment options.  The patient appeared to understand what I have discussed and is in agreement with it.  Encounter Diagnosis  Name Primary?  . Lumbar pain with radiation down right leg Yes    PLAN Call if any problems.  Precautions discussed.  Continue current medications.   Return to clinic 1 month   I have reviewed the [place] web site prior to prescribing narcotic medicine for this patient.   Electronically Signed Sanjuana Kava, MD 6/23/20219:58 AM

## 2020-01-13 ENCOUNTER — Telehealth (HOSPITAL_COMMUNITY): Payer: Self-pay | Admitting: Physical Therapy

## 2020-01-13 NOTE — Telephone Encounter (Signed)
Pt s/w Dr Luna Glasgow and they agreed  She can do HEP and request to be d/c

## 2020-01-14 ENCOUNTER — Ambulatory Visit (HOSPITAL_COMMUNITY): Payer: Medicare HMO | Admitting: Physical Therapy

## 2020-01-14 ENCOUNTER — Encounter (HOSPITAL_COMMUNITY): Payer: Self-pay | Admitting: Physical Therapy

## 2020-01-14 ENCOUNTER — Telehealth (HOSPITAL_COMMUNITY): Payer: Self-pay | Admitting: Physical Therapy

## 2020-01-14 NOTE — Therapy (Signed)
[facility] [place], [place], [address] Phone: [phone]   Fax:  [phone]  Patient Details  Name: Pamela Walter MRN: [id number] Date of Birth: 11-27-1950 Referring Provider:  No ref. provider found  Encounter Date: 01/14/2020    PHYSICAL THERAPY DISCHARGE SUMMARY  Visits from Start of Care: 1  Current functional level related to goals / functional outcomes: Patient stated some improvement in symptoms with HEP. Unable to fully assess goals due to patient not returning since initial evaluation.    Remaining deficits: Patient continues to be limited by low back pain and LE symptoms.   Education / Equipment: HEP and returning to physical therapy if needed.  Plan: Patient agrees to discharge.  Patient goals were not met. Patient is being discharged due to financial reasons.  ?????     10:26 AM, 01/14/20 Mearl Latin PT, DPT Physical Therapist at [facility] [place], [place], [address] Phone: [phone]   Fax:  [phone]

## 2020-01-14 NOTE — Telephone Encounter (Signed)
Contacted patient regarding d/c from PT. She states she is feeling a lot better from HEP and agrees to discharge. Discussed what steps she would need to take if she would like to return and she verbalized understanding.   10:23 AM, 01/14/20 Mearl Latin PT, DPT Physical Therapist at [facility]

## 2020-01-16 ENCOUNTER — Encounter (HOSPITAL_COMMUNITY): Payer: Medicare HMO | Admitting: Physical Therapy

## 2020-02-05 ENCOUNTER — Other Ambulatory Visit: Payer: Self-pay

## 2020-02-05 ENCOUNTER — Ambulatory Visit: Payer: Medicare HMO | Admitting: Orthopaedic Surgery

## 2020-03-11 ENCOUNTER — Encounter: Payer: Self-pay | Admitting: Orthopaedic Surgery

## 2020-03-11 ENCOUNTER — Ambulatory Visit (INDEPENDENT_AMBULATORY_CARE_PROVIDER_SITE_OTHER): Payer: Medicare HMO

## 2020-03-11 ENCOUNTER — Ambulatory Visit (INDEPENDENT_AMBULATORY_CARE_PROVIDER_SITE_OTHER): Payer: Medicare HMO | Admitting: Orthopaedic Surgery

## 2020-03-11 ENCOUNTER — Other Ambulatory Visit: Payer: Self-pay

## 2020-03-11 DIAGNOSIS — M25561 Pain in right knee: Secondary | ICD-10-CM

## 2020-03-11 DIAGNOSIS — G8929 Other chronic pain: Secondary | ICD-10-CM | POA: Diagnosis not present

## 2020-03-11 DIAGNOSIS — M1711 Unilateral primary osteoarthritis, right knee: Secondary | ICD-10-CM

## 2020-03-11 DIAGNOSIS — Z8739 Personal history of other diseases of the musculoskeletal system and connective tissue: Secondary | ICD-10-CM

## 2020-03-11 MED ORDER — BUPIVACAINE HCL 0.5 % IJ SOLN
2.0000 mL | INTRAMUSCULAR | Status: AC | PRN
  Administered 2020-03-11: 2 mL via INTRA_ARTICULAR

## 2020-03-11 MED ORDER — METHYLPREDNISOLONE ACETATE 40 MG/ML IJ SUSP
80.0000 mg | INTRAMUSCULAR | Status: AC | PRN
  Administered 2020-03-11: 80 mg via INTRA_ARTICULAR

## 2020-03-11 MED ORDER — LIDOCAINE HCL 1 % IJ SOLN
2.0000 mL | INTRAMUSCULAR | Status: AC | PRN
  Administered 2020-03-11: 2 mL

## 2020-03-11 NOTE — Progress Notes (Addendum)
Office Visit Note   Patient: Pamela Walter           Date of Birth: [birth year]           MRN: [id number] Visit Date: 03/11/2020              Requested by: Lucianne Lei, MD [address] [place],  [place] [address] PCP: Lucianne Lei, MD   Assessment & Plan: Visit Diagnoses:  1. Chronic pain of right knee   2. H/O calcium pyrophosphate deposition disease (CPPD)   3. Unilateral primary osteoarthritis, right knee     Plan: Recurrent symptoms of osteoarthritis right knee.  Pain predominantly lateral compartment.  Films are consistent with advanced osteoarthritis with CPPD.  Long discussion regarding treatment options.  [name] would like to have another cortisone injection.  This was performed along the lateral compartment without a problem. This patient is diagnosed with osteoarthritis of the knee(s).    Radiographs show evidence of joint space narrowing, osteophytes, subchondral sclerosis and/or subchondral cysts.  This patient has knee pain which interferes with functional and activities of daily living.    This patient has experienced inadequate response, adverse effects and/or intolerance with conservative treatments such as acetaminophen, NSAIDS, topical creams, physical therapy or regular exercise, knee bracing and/or weight loss.   This patient has experienced inadequate response or has a contraindication to intra articular steroid injections for at least 3 months.   This patient is not scheduled to have a total knee replacement within 6 months of starting treatment with viscosupplementation.   Follow-Up Instructions: Return Pre-CERT Visco.   Orders:  Orders Placed This Encounter  Procedures  . Large Joint Inj: R knee  . XR KNEE 3 VIEW RIGHT   No orders of the defined types were placed in this encounter.     Procedures: Large Joint Inj: R knee on 03/11/2020 1:26 PM Indications: pain and diagnostic evaluation Details: 25 G 1.5 in needle, anteromedial  approach  Arthrogram: No  Medications: 2 mL lidocaine 1 %; 2 mL bupivacaine 0.5 %; 80 mg methylPREDNISolone acetate 40 MG/ML Procedure, treatment alternatives, risks and benefits explained, specific risks discussed. Consent was given by the patient. Immediately prior to procedure a time out was called to verify the correct patient, procedure, equipment, support staff and site/side marked as required. Patient was prepped and draped in the usual sterile fashion.       Clinical Data: No additional findings.   Subjective: Chief Complaint  Patient presents with  . Right Knee - Pain  Patient presents today for right knee pain. She was last seen in 2018 with Dr.Whitifeld and received a cortisone injection. Patient states that her knee hurts almost constantly. The pain is worse with stairs. She has swelling. No grinding or giving way.   HPI  Review of Systems   Objective: Vital Signs: There were no vitals taken for this visit.  Physical Exam Constitutional:      Appearance: She is well-developed.  Eyes:     Pupils: Pupils are equal, round, and reactive to light.  Pulmonary:     Effort: Pulmonary effort is normal.  Skin:    General: Skin is warm and dry.  Neurological:     Mental Status: She is alert and oriented to person, place, and time.  Psychiatric:        Behavior: Behavior normal.     Ortho Exam awake alert and oriented x3.  Comfortable sitting.  Slight increased valgus with weightbearing  right lower extremity extremity/knee.  Very minimal effusion.  Mostly lateral joint pain right knee with positive patella crepitation but no pain with patella compression.  No instability.  No popliteal pain or mass.  No calf pain.  Straight leg raise negative.  Painless range of motion both hips.  Specialty Comments:  No specialty comments available.  Imaging: XR KNEE 3 VIEW RIGHT  Result Date: 03/11/2020 Of the right knee were obtained in 3 projections standing.  There are  obvious degenerative changes in all 3 compartments but worse in the lateral compartment with there is narrowing of the joint space and slight increased valgus.  There are osteophytes and subchondral sclerosis laterally as well as calcification of the menisci consistent with CPPD.  Degenerative changes are also identified the patellofemoral joint and films are consistent with advanced osteoarthritis with CPPD    PMFS History: Patient Active Problem List   Diagnosis Date Noted  . Unilateral primary osteoarthritis, right knee 03/11/2020  . H/O calcium pyrophosphate deposition disease (CPPD) 07/14/2016  . Vaginal discharge 06/05/2014   Past Medical History:  Diagnosis Date  . Arthritis   . Hyperlipidemia    borderline  . Hypertension   . Irregular heart rate   . Vaginal discharge 06/05/2014    Family History  Problem Relation Age of Onset  . Hypertension Mother   . Diabetes Mother   . Hypertension Father   . Hypertension Sister   . Cancer Brother        prostate  . Diabetes Son   . Diabetes Brother   . Dementia Brother     Past Surgical History:  Procedure Laterality Date  . CHOLECYSTECTOMY     30 years ago  . COLONOSCOPY  03/01/2011   Procedure: COLONOSCOPY;  Surgeon: Dorothyann Peng, MD;  [facility];  Service: Endoscopy;  Laterality: N/A;  . [place]   Social History   Occupational History  . Not on file  Tobacco Use  . Smoking status: Former Smoker    Types: Cigarettes  . Smokeless tobacco: Never Used  Substance and Sexual Activity  . Alcohol use: Yes    Comment: wine once a month  . Drug use: No  . Sexual activity: Not Currently    Birth control/protection: Post-menopausal

## 2020-03-18 ENCOUNTER — Telehealth: Payer: Self-pay

## 2020-03-18 NOTE — Telephone Encounter (Signed)
Faxed Rx to [facility] for [place], right knee at [phone].  Cb# [phone].

## 2020-03-19 DIAGNOSIS — R69 Illness, unspecified: Secondary | ICD-10-CM | POA: Diagnosis not present

## 2020-03-23 DIAGNOSIS — Z809 Family history of malignant neoplasm, unspecified: Secondary | ICD-10-CM | POA: Diagnosis not present

## 2020-03-23 DIAGNOSIS — Z8249 Family history of ischemic heart disease and other diseases of the circulatory system: Secondary | ICD-10-CM | POA: Diagnosis not present

## 2020-03-23 DIAGNOSIS — Z791 Long term (current) use of non-steroidal anti-inflammatories (NSAID): Secondary | ICD-10-CM | POA: Diagnosis not present

## 2020-03-23 DIAGNOSIS — G8929 Other chronic pain: Secondary | ICD-10-CM | POA: Diagnosis not present

## 2020-03-23 DIAGNOSIS — Z6832 Body mass index (BMI) 32.0-32.9, adult: Secondary | ICD-10-CM | POA: Diagnosis not present

## 2020-03-23 DIAGNOSIS — M1990 Unspecified osteoarthritis, unspecified site: Secondary | ICD-10-CM | POA: Diagnosis not present

## 2020-03-23 DIAGNOSIS — E669 Obesity, unspecified: Secondary | ICD-10-CM | POA: Diagnosis not present

## 2020-03-23 DIAGNOSIS — I10 Essential (primary) hypertension: Secondary | ICD-10-CM | POA: Diagnosis not present

## 2020-03-23 DIAGNOSIS — R32 Unspecified urinary incontinence: Secondary | ICD-10-CM | POA: Diagnosis not present

## 2020-03-23 DIAGNOSIS — I951 Orthostatic hypotension: Secondary | ICD-10-CM | POA: Diagnosis not present

## 2020-03-23 DIAGNOSIS — Z008 Encounter for other general examination: Secondary | ICD-10-CM | POA: Diagnosis not present

## 2020-05-01 DIAGNOSIS — R69 Illness, unspecified: Secondary | ICD-10-CM | POA: Diagnosis not present

## 2020-05-06 ENCOUNTER — Other Ambulatory Visit: Payer: Self-pay

## 2020-05-06 ENCOUNTER — Other Ambulatory Visit (HOSPITAL_COMMUNITY): Payer: Self-pay | Admitting: Family Medicine

## 2020-05-06 ENCOUNTER — Encounter: Payer: Self-pay | Admitting: Orthopaedic Surgery

## 2020-05-06 ENCOUNTER — Ambulatory Visit: Payer: Medicare HMO | Admitting: Orthopaedic Surgery

## 2020-05-06 DIAGNOSIS — Z1231 Encounter for screening mammogram for malignant neoplasm of breast: Secondary | ICD-10-CM

## 2020-05-08 DIAGNOSIS — R69 Illness, unspecified: Secondary | ICD-10-CM | POA: Diagnosis not present

## 2020-05-12 DIAGNOSIS — E7800 Pure hypercholesterolemia, unspecified: Secondary | ICD-10-CM | POA: Diagnosis not present

## 2020-05-12 DIAGNOSIS — I10 Essential (primary) hypertension: Secondary | ICD-10-CM | POA: Diagnosis not present

## 2020-05-12 DIAGNOSIS — R600 Localized edema: Secondary | ICD-10-CM | POA: Diagnosis not present

## 2020-05-12 DIAGNOSIS — E785 Hyperlipidemia, unspecified: Secondary | ICD-10-CM | POA: Diagnosis not present

## 2020-05-12 DIAGNOSIS — M130 Polyarthritis, unspecified: Secondary | ICD-10-CM | POA: Diagnosis not present

## 2020-05-14 ENCOUNTER — Other Ambulatory Visit: Payer: Self-pay

## 2020-05-14 ENCOUNTER — Encounter: Payer: Self-pay | Admitting: Orthopaedic Surgery

## 2020-05-14 ENCOUNTER — Ambulatory Visit (INDEPENDENT_AMBULATORY_CARE_PROVIDER_SITE_OTHER): Payer: Medicare HMO | Admitting: Orthopaedic Surgery

## 2020-05-14 VITALS — Ht 64.0 in | Wt 181.0 lb

## 2020-05-14 DIAGNOSIS — M1711 Unilateral primary osteoarthritis, right knee: Secondary | ICD-10-CM | POA: Diagnosis not present

## 2020-05-14 MED ORDER — LIDOCAINE HCL 1 % IJ SOLN
0.5000 mL | INTRAMUSCULAR | Status: AC | PRN
  Administered 2020-05-14: .5 mL

## 2020-05-14 NOTE — Progress Notes (Signed)
Office Visit Note   Patient: Pamela Walter           Date of Birth: [birth year]           MRN: [id number] Visit Date: 05/14/2020              Requested by: Lucianne Lei, MD [address] [place],  [place] [address] PCP: Lucianne Lei, MD   Assessment & Plan: Visit Diagnoses:  1. Unilateral primary osteoarthritis, right knee     Plan: Patient will be out of town next week she will return the following week on Wednesday to see Dr. Durward Fortes for her second injection of Visco 3 to the right knee.  Follow-Up Instructions: No follow-ups on file.   Orders:  No orders of the defined types were placed in this encounter.  No orders of the defined types were placed in this encounter.     Procedures: Large Joint Inj: R knee on 05/14/2020 10:31 AM Indications: pain and joint swelling Details: 22 G 1.5 in needle, anterolateral approach  Arthrogram: No  Medications: 0.5 mL lidocaine 1 % Outcome: tolerated well, no immediate complications Procedure, treatment alternatives, risks and benefits explained, specific risks discussed. Consent was given by the patient. Immediately prior to procedure a time out was called to verify the correct patient, procedure, equipment, support staff and site/side marked as required. Patient was prepped and draped in the usual sterile fashion.       Clinical Data: No additional findings.   Subjective: Chief Complaint  Patient presents with   Right Knee - Pain    Visco-3 injection #26    HPI 69-year-old female with tricompartment generative arthritis worse lateral compartment right knee here for her first of 3 Visco 3 injections.  Patient failed cortisone injection.  She is followed by Dr. Durward Fortes. Review of Systems updated unchanged.   Objective: Vital Signs: Ht 5\' 4"  (1.626 m)    Wt 181 lb (82.1 kg)    BMI 31.07 kg/m   Physical Exam no change.  Ortho Exam mild valgus right knee crepitus with knee range of motion mild  effusion.  Specialty Comments:  No specialty comments available.  Imaging: No results found.   PMFS History: Patient Active Problem List   Diagnosis Date Noted   Unilateral primary osteoarthritis, right knee 03/11/2020   H/O calcium pyrophosphate deposition disease (CPPD) 07/14/2016   Vaginal discharge 06/05/2014   Past Medical History:  Diagnosis Date   Arthritis    Hyperlipidemia    borderline   Hypertension    Irregular heart rate    Vaginal discharge 06/05/2014    Family History  Problem Relation Age of Onset   Hypertension Mother    Diabetes Mother    Hypertension Father    Hypertension Sister    Cancer Brother        prostate   Diabetes Son    Diabetes Brother    Dementia Brother     Past Surgical History:  Procedure Laterality Date   CHOLECYSTECTOMY     30 years ago   COLONOSCOPY  03/01/2011   Procedure: COLONOSCOPY;  Surgeon: Dorothyann Peng, MD;  [facility];  Service: Endoscopy;  Laterality: N/A;   [place]   Social History   Occupational History   Not on file  Tobacco Use   Smoking status: Former Smoker    Types: Cigarettes   Smokeless tobacco: Never Used  Substance and Sexual Activity   Alcohol  use: Yes    Comment: wine once a month   Drug use: No   Sexual activity: Not Currently    Birth control/protection: Post-menopausal

## 2020-05-28 ENCOUNTER — Other Ambulatory Visit: Payer: Self-pay

## 2020-05-28 ENCOUNTER — Encounter: Payer: Self-pay | Admitting: Orthopaedic Surgery

## 2020-05-28 ENCOUNTER — Ambulatory Visit (INDEPENDENT_AMBULATORY_CARE_PROVIDER_SITE_OTHER): Payer: Medicare HMO | Admitting: Orthopaedic Surgery

## 2020-05-28 VITALS — Ht 64.0 in | Wt 181.0 lb

## 2020-05-28 DIAGNOSIS — M1711 Unilateral primary osteoarthritis, right knee: Secondary | ICD-10-CM | POA: Diagnosis not present

## 2020-05-28 MED ORDER — LIDOCAINE HCL 1 % IJ SOLN
0.5000 mL | INTRAMUSCULAR | Status: AC | PRN
  Administered 2020-05-28: .5 mL

## 2020-05-28 NOTE — Progress Notes (Signed)
Office Visit Note   Patient: Pamela Walter           Date of Birth: [birth year]           MRN: [id number] Visit Date: 05/28/2020              Requested by: Lucianne Lei, MD [address] [place],  [place] [address] PCP: Lucianne Lei, MD   Assessment & Plan: Visit Diagnoses:  1. Unilateral primary osteoarthritis, right knee     Plan: #2 Visco Three injection performed right knee.  Return in 1 week for final Visco 3 injection.  Follow-Up Instructions: Return in about 1 week (around 06/04/2020).   Orders:  No orders of the defined types were placed in this encounter.  No orders of the defined types were placed in this encounter.     Procedures: Large Joint Inj: R knee on 05/28/2020 11:58 AM Indications: pain and joint swelling Details: 22 G 1.5 in needle, anterolateral approach  Arthrogram: No  Medications: 0.5 mL lidocaine 1 % Outcome: tolerated well, no immediate complications Procedure, treatment alternatives, risks and benefits explained, specific risks discussed. Consent was given by the patient. Immediately prior to procedure a time out was called to verify the correct patient, procedure, equipment, support staff and site/side marked as required. Patient was prepped and draped in the usual sterile fashion.       Clinical Data: No additional findings.   Subjective: Chief Complaint  Patient presents with  . Right Knee - Follow-up    Visco 3 injection #2    HPI patient is here for right knee injection Visco 3.  This is her second injection.  First injection went well she is noticing some improvement.  Review of Systems no change   Objective: Vital Signs: Ht 5\' 4"  (1.626 m)   Wt 181 lb (82.1 kg)   BMI 31.07 kg/m   Physical Exam no change Ortho Exam injection site looks good from last week.  No adverse reaction to the injection.  Specialty Comments:  No specialty comments available.  Imaging: No results found.   PMFS History: Patient  Active Problem List   Diagnosis Date Noted  . Unilateral primary osteoarthritis, right knee 03/11/2020  . H/O calcium pyrophosphate deposition disease (CPPD) 07/14/2016  . Vaginal discharge 06/05/2014   Past Medical History:  Diagnosis Date  . Arthritis   . Hyperlipidemia    borderline  . Hypertension   . Irregular heart rate   . Vaginal discharge 06/05/2014    Family History  Problem Relation Age of Onset  . Hypertension Mother   . Diabetes Mother   . Hypertension Father   . Hypertension Sister   . Cancer Brother        prostate  . Diabetes Son   . Diabetes Brother   . Dementia Brother     Past Surgical History:  Procedure Laterality Date  . CHOLECYSTECTOMY     30 years ago  . COLONOSCOPY  03/01/2011   Procedure: COLONOSCOPY;  Surgeon: Dorothyann Peng, MD;  [facility];  Service: Endoscopy;  Laterality: N/A;  . [place]   Social History   Occupational History  . Not on file  Tobacco Use  . Smoking status: Former Smoker    Types: Cigarettes  . Smokeless tobacco: Never Used  Substance and Sexual Activity  . Alcohol use: Yes    Comment: wine once a month  . Drug use: No  .  Sexual activity: Not Currently    Birth control/protection: Post-menopausal

## 2020-06-04 ENCOUNTER — Ambulatory Visit (INDEPENDENT_AMBULATORY_CARE_PROVIDER_SITE_OTHER): Payer: Medicare HMO | Admitting: Orthopaedic Surgery

## 2020-06-04 ENCOUNTER — Other Ambulatory Visit: Payer: Self-pay

## 2020-06-04 ENCOUNTER — Encounter: Payer: Self-pay | Admitting: Orthopaedic Surgery

## 2020-06-04 DIAGNOSIS — M1711 Unilateral primary osteoarthritis, right knee: Secondary | ICD-10-CM

## 2020-06-04 MED ORDER — LIDOCAINE HCL 1 % IJ SOLN
0.5000 mL | INTRAMUSCULAR | Status: AC | PRN
  Administered 2020-06-04: .5 mL

## 2020-06-04 NOTE — Progress Notes (Signed)
   Office Visit Note   Patient: Pamela Walter           Date of Birth: [birth year]           MRN: [id number] Visit Date: 06/04/2020              Requested by: Lucianne Lei, MD [address] [place],  [place] [address] PCP: Lucianne Lei, MD   Assessment & Plan: Visit Diagnoses:  1. Unilateral primary osteoarthritis, right knee     Plan: Third Visco 3 injection performed right knee follow-up as needed.  Follow-Up Instructions: No follow-ups on file.   Orders:  Orders Placed This Encounter  Procedures  . Large Joint Inj   No orders of the defined types were placed in this encounter.     Procedures: Large Joint Inj: R knee on 06/04/2020 9:33 AM Indications: pain and joint swelling Details: 22 G 1.5 in needle, anterolateral approach  Arthrogram: No  Medications: 0.5 mL lidocaine 1 % Outcome: tolerated well, no immediate complications Procedure, treatment alternatives, risks and benefits explained, specific risks discussed. Consent was given by the patient. Immediately prior to procedure a time out was called to verify the correct patient, procedure, equipment, support staff and site/side marked as required. Patient was prepped and draped in the usual sterile fashion.       Clinical Data: No additional findings.   Subjective: Chief Complaint  Patient presents with  . Right Knee - Follow-up    Visco 3 injection #3    HPI follow-up for the third right knee Synvisc injection for knee osteoarthritis.  Review of Systems no change   Objective: Vital Signs: BP (!) 178/89   Ht 5\' 4"  (1.626 m)   Wt 181 lb (82.1 kg)   BMI 31.07 kg/m   Physical Exam no change  Ortho Exam no change  Specialty Comments:  No specialty comments available.  Imaging: No results found.   PMFS History: Patient Active Problem List   Diagnosis Date Noted  . Unilateral primary osteoarthritis, right knee 03/11/2020  . H/O calcium pyrophosphate deposition disease (CPPD)  07/14/2016  . Vaginal discharge 06/05/2014   Past Medical History:  Diagnosis Date  . Arthritis   . Hyperlipidemia    borderline  . Hypertension   . Irregular heart rate   . Vaginal discharge 06/05/2014    Family History  Problem Relation Age of Onset  . Hypertension Mother   . Diabetes Mother   . Hypertension Father   . Hypertension Sister   . Cancer Brother        prostate  . Diabetes Son   . Diabetes Brother   . Dementia Brother     Past Surgical History:  Procedure Laterality Date  . CHOLECYSTECTOMY     30 years ago  . COLONOSCOPY  03/01/2011   Procedure: COLONOSCOPY;  Surgeon: Dorothyann Peng, MD;  [facility];  Service: Endoscopy;  Laterality: N/A;  . [place]   Social History   Occupational History  . Not on file  Tobacco Use  . Smoking status: Former Smoker    Types: Cigarettes  . Smokeless tobacco: Never Used  Substance and Sexual Activity  . Alcohol use: Yes    Comment: wine once a month  . Drug use: No  . Sexual activity: Not Currently    Birth control/protection: Post-menopausal

## 2020-06-17 ENCOUNTER — Other Ambulatory Visit: Payer: Self-pay

## 2020-06-17 ENCOUNTER — Ambulatory Visit (HOSPITAL_COMMUNITY)
Admission: RE | Admit: 2020-06-17 | Discharge: 2020-06-17 | Disposition: A | Discharge status: Home / Self Care | Payer: Medicare HMO | Source: Ambulatory Visit | Attending: Family Medicine | Admitting: Family Medicine

## 2020-06-17 DIAGNOSIS — Z1231 Encounter for screening mammogram for malignant neoplasm of breast: Secondary | ICD-10-CM

## 2020-07-06 ENCOUNTER — Telehealth: Payer: Self-pay | Admitting: Radiology

## 2020-07-06 NOTE — Telephone Encounter (Signed)
Might be best to see Dr Luna Glasgow

## 2020-07-06 NOTE — Telephone Encounter (Signed)
Please see message from Dr. Durward Fortes below.

## 2020-07-06 NOTE — Telephone Encounter (Signed)
Please see message from [facility] office below.  Can you advise or send to Dr. Luna Glasgow?  Pt would like a refill on her hydrocodone. States the injections on her knee did not work and will schedule an appointment to be seen again after the holidays.   Saw [facility] for back in 12/2019 and he prescribed hydrocodone  Saw [facility] for knee and he did not prescribe   Wasn't sure which doctor this should be sent to.   She uses [facility] in [place] and can be reached ar [phone] or [phone]

## 2020-07-21 ENCOUNTER — Telehealth: Payer: Self-pay

## 2020-07-21 NOTE — Telephone Encounter (Signed)
Put in by error.  

## 2020-07-23 ENCOUNTER — Encounter: Payer: Self-pay | Admitting: Orthopaedic Surgery

## 2020-07-23 ENCOUNTER — Ambulatory Visit (INDEPENDENT_AMBULATORY_CARE_PROVIDER_SITE_OTHER): Payer: Medicare HMO | Admitting: Orthopaedic Surgery

## 2020-07-23 VITALS — Ht 64.0 in | Wt 181.0 lb

## 2020-07-23 DIAGNOSIS — M1711 Unilateral primary osteoarthritis, right knee: Secondary | ICD-10-CM | POA: Diagnosis not present

## 2020-07-23 MED ORDER — BUPIVACAINE HCL 0.25 % IJ SOLN
4.0000 mL | INTRAMUSCULAR | Status: AC | PRN
  Administered 2020-07-23: 4 mL via INTRA_ARTICULAR

## 2020-07-23 MED ORDER — METHYLPREDNISOLONE ACETATE 40 MG/ML IJ SUSP
40.0000 mg | INTRAMUSCULAR | Status: AC | PRN
  Administered 2020-07-23: 40 mg via INTRA_ARTICULAR

## 2020-07-23 MED ORDER — LIDOCAINE HCL 1 % IJ SOLN
0.5000 mL | INTRAMUSCULAR | Status: AC | PRN
  Administered 2020-07-23: .5 mL

## 2020-07-23 NOTE — Progress Notes (Signed)
Office Visit Note   Patient: Pamela Walter           Date of Birth: [birth year]           MRN: [id number] Visit Date: 07/23/2020              Requested by: Renaye Rakers, MD [address] [place],  [place] [address] PCP: Renaye Rakers, MD   Assessment & Plan: Visit Diagnoses:  1. Unilateral primary osteoarthritis, right knee     Plan: Patient requested cortisone injection which was performed.  She needs to talk with her daughter about when to be a good time for her to be available for about a week to help after right total knee arthroplasty.  She can return in 8 or 9 weeks we can discuss this further.  Has progressive arthritis at the point where she is having trouble resting and normal community ambulation activities.  Today we discussed total knee arthroplasty and postop rehab in detail.  Follow-Up Instructions: Return in about 8 weeks (around 09/17/2020).   Orders:  No orders of the defined types were placed in this encounter.  No orders of the defined types were placed in this encounter.     Procedures: Large Joint Inj: R knee on 07/23/2020 12:23 PM Indications: pain and joint swelling Details: 22 G 1.5 in needle, anterolateral approach  Arthrogram: No  Medications: 40 mg methylPREDNISolone acetate 40 MG/ML; 0.5 mL lidocaine 1 %; 4 mL bupivacaine 0.25 % Outcome: tolerated well, no immediate complications Procedure, treatment alternatives, risks and benefits explained, specific risks discussed. Consent was given by the patient. Immediately prior to procedure a time out was called to verify the correct patient, procedure, equipment, support staff and site/side marked as required. Patient was prepped and draped in the usual sterile fashion.       Clinical Data: No additional findings.   Subjective: Chief Complaint  Patient presents with  . Right Knee - Pain  . Left Knee - Pain    HPI 70 year old female returns with progressive right knee primary  osteoarthritis.  She has had 3 Visco injections last fall.  Right knee is getting worse she is noticing progressive valgus difficulty walking at times has not had to use a cane.  She has to be careful where she steps pain bothers her sometimes when she tries to sleep.  She is requesting a cortisone injection today and is questioning whether she should be on hydrocodone to help with the pain.  She had some leftover after her treatment for her back and states that it did not help.  Patient has a daughter who lives close to her but just went back to work after being out for few months after back surgery.  Review of Systems updated unchanged patient's been active healthy other than problems with her knees with bilateral knee osteoarthritis worse on the right than left.   Objective: Vital Signs: Ht 5\' 4"  (1.626 m)   Wt 181 lb (82.1 kg)   BMI 31.07 kg/m   Physical Exam Constitutional:      Appearance: She is well-developed.  HENT:     Head: Normocephalic.     Right Ear: External ear normal.     Left Ear: External ear normal.  Eyes:     Pupils: Pupils are equal, round, and reactive to light.  Neck:     Thyroid: No thyromegaly.     Trachea: No tracheal deviation.  Cardiovascular:     Rate and  Rhythm: Normal rate.  Pulmonary:     Effort: Pulmonary effort is normal.  Abdominal:     Palpations: Abdomen is soft.  Skin:    General: Skin is warm and dry.  Neurological:     Mental Status: She is alert and oriented to person, place, and time.  Psychiatric:        Mood and Affect: Mood and affect normal.        Behavior: Behavior normal.     Ortho Exam patient has knee crepitus mild valgus right knee.  She is amatory with a right knee limp.  Negative logroll of the hips distal pulses are intact negative Homan.  Anterior tib gastrocsoleus is normal. Specialty Comments:  No specialty comments available.  Imaging: No results found.   PMFS History: Patient Active Problem List    Diagnosis Date Noted  . Unilateral primary osteoarthritis, right knee 03/11/2020  . H/O calcium pyrophosphate deposition disease (CPPD) 07/14/2016  . Vaginal discharge 06/05/2014   Past Medical History:  Diagnosis Date  . Arthritis   . Hyperlipidemia    borderline  . Hypertension   . Irregular heart rate   . Vaginal discharge 06/05/2014    Family History  Problem Relation Age of Onset  . Hypertension Mother   . Diabetes Mother   . Hypertension Father   . Hypertension Sister   . Cancer Brother        prostate  . Diabetes Son   . Diabetes Brother   . Dementia Brother     Past Surgical History:  Procedure Laterality Date  . CHOLECYSTECTOMY     30 years ago  . COLONOSCOPY  03/01/2011   Procedure: COLONOSCOPY;  Surgeon: Dorothyann Peng, MD;  [facility];  Service: Endoscopy;  Laterality: N/A;  . [place]   Social History   Occupational History  . Not on file  Tobacco Use  . Smoking status: Former Smoker    Types: Cigarettes  . Smokeless tobacco: Never Used  Substance and Sexual Activity  . Alcohol use: Yes    Comment: wine once a month  . Drug use: No  . Sexual activity: Not Currently    Birth control/protection: Post-menopausal

## 2020-09-10 ENCOUNTER — Ambulatory Visit (INDEPENDENT_AMBULATORY_CARE_PROVIDER_SITE_OTHER): Payer: Medicare HMO | Admitting: Orthopaedic Surgery

## 2020-09-10 ENCOUNTER — Other Ambulatory Visit: Payer: Self-pay

## 2020-09-10 ENCOUNTER — Encounter: Payer: Self-pay | Admitting: Orthopaedic Surgery

## 2020-09-10 VITALS — Ht 64.0 in | Wt 187.0 lb

## 2020-09-10 DIAGNOSIS — M1711 Unilateral primary osteoarthritis, right knee: Secondary | ICD-10-CM | POA: Diagnosis not present

## 2020-09-10 NOTE — Progress Notes (Signed)
Office Visit Note   Patient: Pamela Walter           Date of Birth: 1950/08/05           MRN: [id number] Visit Date: 09/10/2020              Requested by: Lucianne Lei, MD [facility] [address] [place],  [place] [address] PCP: Lucianne Lei, MD   Assessment & Plan: Visit Diagnoses:  1. Unilateral primary osteoarthritis, right knee     Plan: Patient lives with her daughter who be available to help with her postop total knee arthroplasty care. We discussed operative technique, spinal anesthesia overnight stay, home health physical therapy, outpatient therapy etc. Risk surgery discussed. She need medical clearance by Dr.Bland but for her age has been healthy and active. Preop medical clearance with Dr.Bland.  Follow-Up Instructions: No follow-ups on file.   Orders:  No orders of the defined types were placed in this encounter.  No orders of the defined types were placed in this encounter.     Procedures: No procedures performed   Clinical Data: No additional findings.   Subjective: Chief Complaint  Patient presents with  . Right Knee - Pain    HPI 70 year old female returns for progressive right knee osteoarthritis. She has been treated with anti-inflammatories she has had cortisone injections, Visco injections x3 last fall. She has had progressive valgus of the knee and now wants to proceed with right total knee arthroplasty. Last cortisone injection 07/23/2020 only gave her a week or so of relief.  Review of Systems patient been active and healthy she does have hypertension and is on medication for this. No history of cardiac problems neurologic negative endocrinologic negative. Positive history of CPPD   Objective: Vital Signs: Ht 5\' 4"  (1.626 m)   Wt 187 lb (84.8 kg)   BMI 32.10 kg/m   Physical Exam Constitutional:      Appearance: She is well-developed.  HENT:     Head: Normocephalic.     Right Ear: External ear normal.     Left Ear: External ear normal.   Eyes:     Pupils: Pupils are equal, round, and reactive to light.  Neck:     Thyroid: No thyromegaly.     Trachea: No tracheal deviation.  Cardiovascular:     Rate and Rhythm: Normal rate.  Pulmonary:     Effort: Pulmonary effort is normal.  Abdominal:     Palpations: Abdomen is soft.  Skin:    General: Skin is warm and dry.  Neurological:     Mental Status: She is alert and oriented to person, place, and time.  Psychiatric:        Mood and Affect: Mood and affect normal.        Behavior: Behavior normal.     Ortho Exam patient has right knee crepitus mild valgus less than 10 degrees right knee. Left knee straight. Crepitus with knee range of motion 2+ knee effusion. Slight laxity lateral collateral due to knee valgus. Distal pulses are palpable negative logroll of the hips. Knee and ankle jerk are intact.  Specialty Comments:  No specialty comments available.  Imaging: X-rays obtained of her knee by Dr. Durward Fortes 03/11/2020 showed chondrocalcinosis of the meniscus, marginal osteophytes subchondral sclerosis mild valgus of the right knee loss of joint space and tricompartmental degenerative changes.   PMFS History: Patient Active Problem List   Diagnosis Date Noted  . Unilateral primary osteoarthritis, right knee 03/11/2020  .  H/O calcium pyrophosphate deposition disease (CPPD) 07/14/2016  . Vaginal discharge 06/05/2014   Past Medical History:  Diagnosis Date  . Arthritis   . Hyperlipidemia    borderline  . Hypertension   . Irregular heart rate   . Vaginal discharge 06/05/2014    Family History  Problem Relation Age of Onset  . Hypertension Mother   . Diabetes Mother   . Hypertension Father   . Hypertension Sister   . Cancer Brother        prostate  . Diabetes Son   . Diabetes Brother   . Dementia Brother     Past Surgical History:  Procedure Laterality Date  . CHOLECYSTECTOMY     30 years ago  . COLONOSCOPY  03/01/2011   Procedure: COLONOSCOPY;   Surgeon: Dorothyann Peng, MD;  [facility];  Service: Endoscopy;  Laterality: N/A;  . [place]   Social History   Occupational History  . Not on file  Tobacco Use  . Smoking status: Former Smoker    Types: Cigarettes  . Smokeless tobacco: Never Used  Substance and Sexual Activity  . Alcohol use: Yes    Comment: wine once a month  . Drug use: No  . Sexual activity: Not Currently    Birth control/protection: Post-menopausal

## 2020-09-14 DIAGNOSIS — R252 Cramp and spasm: Secondary | ICD-10-CM | POA: Diagnosis not present

## 2020-09-14 DIAGNOSIS — M130 Polyarthritis, unspecified: Secondary | ICD-10-CM | POA: Diagnosis not present

## 2020-09-14 DIAGNOSIS — E785 Hyperlipidemia, unspecified: Secondary | ICD-10-CM | POA: Diagnosis not present

## 2020-09-14 DIAGNOSIS — R000 Tachycardia, unspecified: Secondary | ICD-10-CM | POA: Diagnosis not present

## 2020-09-14 DIAGNOSIS — E1169 Type 2 diabetes mellitus with other specified complication: Secondary | ICD-10-CM | POA: Diagnosis not present

## 2020-09-14 DIAGNOSIS — E559 Vitamin D deficiency, unspecified: Secondary | ICD-10-CM | POA: Diagnosis not present

## 2020-09-14 DIAGNOSIS — I10 Essential (primary) hypertension: Secondary | ICD-10-CM | POA: Diagnosis not present

## 2020-10-01 NOTE — Progress Notes (Signed)
Cardiology Office Note:   Date:  10/02/2020  NAME:  Pamela Walter    MRN: [id number] DOB:  01-15-1951   PCP:  Lucianne Lei, MD  Cardiologist:  No primary care provider on file.  Electrophysiologist:  None   Referring MD: Marybelle Killings, MD   Chief Complaint  Patient presents with  . Pre-op Exam    History of Present Illness:   Pamela Walter is a 70 y.o. female with a hx of HTN, HLD who is being seen today for the evaluation of preoperative assessment at the request of Marybelle Killings, MD.  She reports he will have right knee surgery.  She was evaluated by her primary care physician an EKG as part of that work-up.  I did review the EKG that demonstrates normal sinus rhythm with a right bundle block.  For some reason it was labeled as supraventricular tachycardia.  I think the computer may have incorrectly read this.  Her EKG in the office today demonstrates normal sinus rhythm with a right bundle branch block.  No recent echocardiogram in our system.  She reports she had an irregular heartbeat years ago.  She takes metoprolol for this.  She denies any symptoms of chest pain or trouble breathing.  She seems to be doing well.  She reports she can climb a flight of stairs without any chest pain or shortness of breath.  She is limited by her right knee arthritis.  She is not diabetic.  She never had a heart attack or stroke.  She is without any major symptoms today.  She does have high cholesterol.  Most recent LDL 134.  She is now on a statin.  Given her age is likely okay.  She does follow with her primary care doctor Dr. Criss Rosales.  She is a former smoker.  She smokes half pack a day for 30 years.  She quit years ago.  Denies any alcohol use.  No illicit drug use reported.  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  She has 2 children and 3 grandchildren.  She has all granddaughters.  She denies any major symptoms in office today.  Her cardiovascular examination is  unremarkable.  She does report trace edema in her lower extremities.  Worsening today.  She is on amlodipine.  We discussed that this could be the cause of this.  She will discuss this with her primary care physician.  Problem list 1.  Hypertension 2.  Hyperlipidemia -Total cholesterol 218, HDL 51, LDL 134, triglycerides 191 -A1c 6.1 3. RBBB  Past Medical History: Past Medical History:  Diagnosis Date  . Arthritis   . Hyperlipidemia    borderline  . Hypertension   . Irregular heart rate   . Vaginal discharge 06/05/2014    Past Surgical History: Past Surgical History:  Procedure Laterality Date  . CHOLECYSTECTOMY     30 years ago  . COLONOSCOPY  03/01/2011   Procedure: COLONOSCOPY;  Surgeon: Dorothyann Peng, MD;  [facility];  Service: Endoscopy;  Laterality: N/A;  . TEMPOROMANDIBULAR JOINT SURGERY  1993    Current Medications: Current Meds  Medication Sig  . acetaminophen (TYLENOL) 650 MG CR tablet Take 650 mg by mouth every 8 (eight) hours as needed for pain.  Marland Kitchen amLODipine (NORVASC) 5 MG tablet Take 5 mg by mouth daily.   . busPIRone (BUSPAR) 5 MG tablet TAKE 1 TABLET BY MOUTH TWICE A DAY  . D3-50 1.25  MG (50000 UT) capsule Take 50,000 Units by mouth once a week.  . metoprolol succinate (TOPROL-XL) 25 MG 24 hr tablet TAKE ONE TABLET BY MOUTH EVERY DAY  . naproxen (NAPROSYN) 500 MG tablet Take 1 tablet (500 mg total) by mouth 2 (two) times daily with a meal.     Allergies:    Patient has no known allergies.   Social History: Social History   Socioeconomic History  . Marital status: Widowed    Spouse name: Not on file  . Number of children: 2  . Years of education: Not on file  . Highest education level: Not on file  Occupational History  . Occupation: retired Therapist, art  Tobacco Use  . Smoking status: Former Smoker    Packs/day: 0.50    Years: 30.00    Pack years: 15.00    Types: Cigarettes  . Smokeless tobacco: Never Used   Substance and Sexual Activity  . Alcohol use: Yes    Comment: wine once a month  . Drug use: No  . Sexual activity: Not Currently    Birth control/protection: Post-menopausal  Other Topics Concern  . Not on file  Social History Narrative  . Not on file   Social Determinants of Health   Financial Resource Strain: Not on file  Food Insecurity: Not on file  Transportation Needs: Not on file  Physical Activity: Not on file  Stress: Not on file  Social Connections: Not on file     Family History: The patient's family history includes Cancer in her brother and mother; Dementia in her brother; Diabetes in her brother, mother, and son; Heart attack in her father; Hypertension in her father, mother, and sister.  ROS:   All other ROS reviewed and negative. Pertinent positives noted in the HPI.     EKGs/Labs/Other Studies Reviewed:   The following studies were personally reviewed by me today:  EKG:  EKG is ordered today.  The ekg ordered today demonstrates normal sinus rhythm heart rate 69, right bundle branch, and was personally reviewed by me.   Recent Labs: No results found for requested labs within last 8760 hours.   Recent Lipid Panel No results found for: CHOL, TRIG, HDL, CHOLHDL, VLDL, LDLCALC, LDLDIRECT  Physical Exam:   VS:  BP (!) 148/82 (BP Location: Left Arm, Patient Position: Sitting)   Pulse 69   Ht 5\' 4"  (1.626 m)   Wt 197 lb 9.6 oz (89.6 kg)   SpO2 99%   BMI 33.92 kg/m    Wt Readings from Last 3 Encounters:  10/02/20 197 lb 9.6 oz (89.6 kg)  09/10/20 187 lb (84.8 kg)  07/23/20 181 lb (82.1 kg)    General: Well nourished, well developed, in no acute distress Head: Atraumatic, normal size  Eyes: PEERLA, EOMI  Neck: Supple, no JVD Endocrine: No thryomegaly Cardiac: Normal S1, S2; RRR; no murmurs, rubs, or gallops Lungs: Clear to auscultation bilaterally, no wheezing, rhonchi or rales  Abd: Soft, nontender, no hepatomegaly  Ext: No edema, pulses  2+ Musculoskeletal: No deformities, BUE and BLE strength normal and equal Skin: Warm and dry, no rashes   Neuro: Alert and oriented to person, place, time, and situation, CNII-XII grossly intact, no focal deficits  Psych: Normal mood and affect   ASSESSMENT:   Pamela Walter is a 70 y.o. female who presents for the following: 1. Preoperative cardiovascular examination   2. Primary hypertension   3. RBBB     PLAN:   1. Preoperative  cardiovascular examination -The Revised Cardiac Risk Index = 0 which equates to 0.4% estimated risk of perioperative myocardial infarction, pulmonary edema, ventricular fibrillation, cardiac arrest, or complete heart block.  -She can complete greater than 4 METS without any symptoms of chest pain or shortness of breath.  Her cardiovascular examination is normal. -She does have a right bundle branch block.  I suspect this is benign related to smoking in the past.  I have recommended an echocardiogram.  We will obtain this since she does not have a surgery date yet.  I would recommend no further testing in this.  As long as her echocardiogram is normal she may proceed to surgery at acceptable risk.  2. Primary hypertension -Well-controlled today.  Some trace edema in the lower extremities.  I discussed that she may want to transition off amlodipine to a different agent.  She will discuss this with her primary care physician.  3. RBBB -We will obtain an echocardiogram prior to surgery.  She has no cardiovascular symptoms reported to me today.  Her examination is very normal as well.  I suspect this is a benign entity.  She has no evidence of supraventricular tachycardia or any arrhythmia.  I believe the EKG from the primary care physician office was read incorrectly computer.  Disposition: Return if symptoms worsen or fail to improve.  Medication Adjustments/Labs and Tests Ordered: Current medicines are reviewed at length with the patient today.  Concerns  regarding medicines are outlined above.  Orders Placed This Encounter  Procedures  . EKG 12-Lead  . ECHOCARDIOGRAM COMPLETE   No orders of the defined types were placed in this encounter.   Patient Instructions  Medication Instructions:  The current medical regimen is effective;  continue present plan and medications.  *If you need a refill on your cardiac medications before your next appointment, please call your pharmacy*   Testing/Procedures: Echocardiogram - Your physician has requested that you have an echocardiogram. Echocardiography is a painless test that uses sound waves to create images of your heart. It provides your doctor with information about the size and shape of your heart and how well your heart's chambers and valves are working. This procedure takes approximately one hour. There are no restrictions for this procedure. This will be performed at [place]: At [facility], you and your health needs are our priority.  As part of our continuing mission to provide you with exceptional heart care, we have created designated Provider Care Teams.  These Care Teams include your primary Cardiologist (physician) and Advanced Practice Providers (APPs -  Physician Assistants and Nurse Practitioners) who all work together to provide you with the care you need, when you need it.  We recommend signing up for the patient portal called "MyChart".  Sign up information is provided on this After Visit Summary.  MyChart is used to connect with patients for Virtual Visits (Telemedicine).  Patients are able to view lab/test results, encounter notes, upcoming appointments, etc.  Non-urgent messages can be sent to your provider as well.   To learn more about what you can do with MyChart, go to [email or web address].    Your next appointment:   As needed  The format for your next appointment:   In Person  Provider:   Eleonore Chiquito, MD          Signed, Addison Naegeli. Audie Box, MD, [place]  [address], [place], [place] [address] [phone]  10/02/2020 11:13 AM

## 2020-10-02 ENCOUNTER — Encounter: Payer: Self-pay | Admitting: Cardiovascular Disease

## 2020-10-02 ENCOUNTER — Ambulatory Visit: Payer: Medicare HMO | Admitting: Cardiovascular Disease

## 2020-10-02 ENCOUNTER — Other Ambulatory Visit: Payer: Self-pay

## 2020-10-02 VITALS — BP 148/82 | HR 69 | Ht 64.0 in | Wt 197.6 lb

## 2020-10-02 DIAGNOSIS — Z01810 Encounter for preprocedural cardiovascular examination: Secondary | ICD-10-CM

## 2020-10-02 DIAGNOSIS — I4510 Unspecified right bundle-branch block: Secondary | ICD-10-CM

## 2020-10-02 DIAGNOSIS — I10 Essential (primary) hypertension: Secondary | ICD-10-CM | POA: Diagnosis not present

## 2020-10-02 NOTE — Patient Instructions (Addendum)
Medication Instructions:  The current medical regimen is effective;  continue present plan and medications.  *If you need a refill on your cardiac medications before your next appointment, please call your pharmacy*   Testing/Procedures: Echocardiogram - Your physician has requested that you have an echocardiogram. Echocardiography is a painless test that uses sound waves to create images of your heart. It provides your doctor with information about the size and shape of your heart and how well your heart's chambers and valves are working. This procedure takes approximately one hour. There are no restrictions for this procedure. This will be performed at [place]: At [facility], you and your health needs are our priority.  As part of our continuing mission to provide you with exceptional heart care, we have created designated Provider Care Teams.  These Care Teams include your primary Cardiologist (physician) and Advanced Practice Providers (APPs -  Physician Assistants and Nurse Practitioners) who all work together to provide you with the care you need, when you need it.  We recommend signing up for the patient portal called "MyChart".  Sign up information is provided on this After Visit Summary.  MyChart is used to connect with patients for Virtual Visits (Telemedicine).  Patients are able to view lab/test results, encounter notes, upcoming appointments, etc.  Non-urgent messages can be sent to your provider as well.   To learn more about what you can do with MyChart, go to [email or web address].    Your next appointment:   As needed  The format for your next appointment:   In Person  Provider:   Eleonore Chiquito, MD

## 2020-10-21 ENCOUNTER — Other Ambulatory Visit: Payer: Self-pay

## 2020-10-21 ENCOUNTER — Ambulatory Visit (HOSPITAL_COMMUNITY)
Admission: RE | Admit: 2020-10-21 | Discharge: 2020-10-21 | Disposition: A | Discharge status: Home / Self Care | Payer: Medicare HMO | Source: Ambulatory Visit | Attending: Cardiovascular Disease | Admitting: Cardiovascular Disease

## 2020-10-21 DIAGNOSIS — I4510 Unspecified right bundle-branch block: Secondary | ICD-10-CM | POA: Diagnosis not present

## 2020-10-21 LAB — ECHOCARDIOGRAM COMPLETE
AR max vel: 1.71 cm2
AV Area VTI: 1.93 cm2
AV Area mean vel: 1.94 cm2
AV Mean grad: 2.9 mmHg
AV Peak grad: 5 mmHg
Ao pk vel: 1.12 m/s
Area-P 1/2: 2.11 cm2
S' Lateral: 2.1 cm

## 2020-10-21 NOTE — Progress Notes (Signed)
*  PRELIMINARY RESULTS* Echocardiogram 2D Echocardiogram has been performed.  Pamela Walter 10/21/2020, 11:27 AM

## 2020-10-26 DIAGNOSIS — M130 Polyarthritis, unspecified: Secondary | ICD-10-CM | POA: Diagnosis not present

## 2020-10-26 DIAGNOSIS — E785 Hyperlipidemia, unspecified: Secondary | ICD-10-CM | POA: Diagnosis not present

## 2020-10-26 DIAGNOSIS — I10 Essential (primary) hypertension: Secondary | ICD-10-CM | POA: Diagnosis not present

## 2020-10-30 DIAGNOSIS — E669 Obesity, unspecified: Secondary | ICD-10-CM | POA: Diagnosis not present

## 2020-10-30 DIAGNOSIS — Z8249 Family history of ischemic heart disease and other diseases of the circulatory system: Secondary | ICD-10-CM | POA: Diagnosis not present

## 2020-10-30 DIAGNOSIS — R32 Unspecified urinary incontinence: Secondary | ICD-10-CM | POA: Diagnosis not present

## 2020-10-30 DIAGNOSIS — Z823 Family history of stroke: Secondary | ICD-10-CM | POA: Diagnosis not present

## 2020-10-30 DIAGNOSIS — I10 Essential (primary) hypertension: Secondary | ICD-10-CM | POA: Diagnosis not present

## 2020-10-30 DIAGNOSIS — G8929 Other chronic pain: Secondary | ICD-10-CM | POA: Diagnosis not present

## 2020-10-30 DIAGNOSIS — M1990 Unspecified osteoarthritis, unspecified site: Secondary | ICD-10-CM | POA: Diagnosis not present

## 2020-10-30 DIAGNOSIS — Z809 Family history of malignant neoplasm, unspecified: Secondary | ICD-10-CM | POA: Diagnosis not present

## 2020-10-30 DIAGNOSIS — Z833 Family history of diabetes mellitus: Secondary | ICD-10-CM | POA: Diagnosis not present

## 2020-10-30 DIAGNOSIS — Z791 Long term (current) use of non-steroidal anti-inflammatories (NSAID): Secondary | ICD-10-CM | POA: Diagnosis not present

## 2020-11-03 ENCOUNTER — Other Ambulatory Visit: Payer: Self-pay

## 2020-11-11 NOTE — Progress Notes (Signed)
Surgical Instructions    Your procedure is scheduled on 11/16/20.  Report to [facility] Main Entrance "A" at 06:45 A.M., then check in with the Admitting office.  Call this number if you have problems the morning of surgery:  [phone]   If you have any questions prior to your surgery date call [phone]: Open Monday-Friday 8am-4pm    Remember:  Do not eat after midnight the night before your surgery  You may drink clear liquids until 05:45am the morning of your surgery.   Clear liquids allowed are: Water, Non-Citrus Juices (without pulp), Carbonated Beverages, Clear Tea, Black Coffee Only, and Gatorade    Take these medicines the morning of surgery with A SIP OF WATER  acetaminophen (TYLENOL) if needed for pain amLODipine (NORVASC) busPIRone (BUSPAR) if needed for anxiety metoprolol succinate (TOPROL-XL)    As of today, STOP taking any Aspirin (unless otherwise instructed by your surgeon) Aleve, Naproxen, Ibuprofen, Motrin, Advil, Goody's, BC's, all herbal medications, fish oil, and all vitamins.                     Do not wear jewelry, make up, or nail polish            Do not wear lotions, powders, perfumes/colognes, or deodorant.            Do not shave 48 hours prior to surgery.              Do not bring valuables to the hospital.            [facility] is not responsible for any belongings or valuables.  Do NOT Smoke (Tobacco/Vaping) or drink Alcohol 24 hours prior to your procedure If you use a CPAP at night, you may bring all equipment for your overnight stay.   Contacts, glasses, dentures or bridgework may not be worn into surgery, please bring cases for these belongings   For patients admitted to the hospital, discharge time will be determined by your treatment team.   Patients discharged the day of surgery will not be allowed to drive home, and someone needs to stay with them for 24 hours.    Special instructions:   [place]- Preparing For  Surgery  Before surgery, you can play an important role. Because skin is not sterile, your skin needs to be as free of germs as possible. You can reduce the number of germs on your skin by washing with CHG (chlorahexidine gluconate) Soap before surgery.  CHG is an antiseptic cleaner which kills germs and bonds with the skin to continue killing germs even after washing.    Oral Hygiene is also important to reduce your risk of infection.  Remember - BRUSH YOUR TEETH THE MORNING OF SURGERY WITH YOUR REGULAR TOOTHPASTE  Please do not use if you have an allergy to CHG or antibacterial soaps. If your skin becomes reddened/irritated stop using the CHG.  Do not shave (including legs and underarms) for at least 48 hours prior to first CHG shower. It is OK to shave your face.  Please follow these instructions carefully.   1. Shower the NIGHT BEFORE SURGERY and the MORNING OF SURGERY  2. If you chose to wash your hair, wash your hair first as usual with your normal shampoo.  3. After you shampoo, rinse your hair and body thoroughly to remove the shampoo.  4. Wash Face and genitals (private parts) with your normal soap.   5.  Shower the [employer] BEFORE SURGERY  and the MORNING OF SURGERY with CHG Soap.   6. Use CHG Soap as you would any other liquid soap. You can apply CHG directly to the skin and wash gently with a scrungie or a clean washcloth.   7. Apply the CHG Soap to your body ONLY FROM THE NECK DOWN.  Do not use on open wounds or open sores. Avoid contact with your eyes, ears, mouth and genitals (private parts). Wash Face and genitals (private parts)  with your normal soap.   8. Wash thoroughly, paying special attention to the area where your surgery will be performed.  9. Thoroughly rinse your body with warm water from the neck down.  10. DO NOT shower/wash with your normal soap after using and rinsing off the CHG Soap.  11. Pat yourself dry with a CLEAN TOWEL.  12. Wear CLEAN PAJAMAS to bed  the night before surgery  13. Place CLEAN SHEETS on your bed the night before your surgery  14. DO NOT SLEEP WITH PETS.   Day of Surgery: Take a shower with CHG soap. Wear Clean/Comfortable clothing the morning of surgery Do not apply any deodorants/lotions.   Remember to brush your teeth WITH YOUR REGULAR TOOTHPASTE.   Please read over the following fact sheets that you were given.

## 2020-11-12 ENCOUNTER — Encounter (HOSPITAL_COMMUNITY)
Admission: RE | Admit: 2020-11-12 | Discharge: 2020-11-12 | Disposition: A | Discharge status: Home / Self Care | Payer: Medicare HMO | Source: Ambulatory Visit | Attending: Orthopaedic Surgery | Admitting: Orthopaedic Surgery

## 2020-11-12 ENCOUNTER — Encounter: Payer: Self-pay | Admitting: Surgery

## 2020-11-12 ENCOUNTER — Other Ambulatory Visit: Payer: Self-pay

## 2020-11-12 ENCOUNTER — Telehealth: Payer: Self-pay

## 2020-11-12 ENCOUNTER — Ambulatory Visit (INDEPENDENT_AMBULATORY_CARE_PROVIDER_SITE_OTHER): Payer: Medicare HMO | Admitting: Surgery

## 2020-11-12 ENCOUNTER — Encounter (HOSPITAL_COMMUNITY): Payer: Self-pay

## 2020-11-12 VITALS — BP 154/76 | HR 73 | Ht 64.0 in | Wt 197.6 lb

## 2020-11-12 DIAGNOSIS — Z01818 Encounter for other preprocedural examination: Secondary | ICD-10-CM | POA: Diagnosis not present

## 2020-11-12 DIAGNOSIS — I4510 Unspecified right bundle-branch block: Secondary | ICD-10-CM | POA: Diagnosis not present

## 2020-11-12 DIAGNOSIS — M1711 Unilateral primary osteoarthritis, right knee: Secondary | ICD-10-CM

## 2020-11-12 DIAGNOSIS — Z20822 Contact with and (suspected) exposure to covid-19: Secondary | ICD-10-CM | POA: Diagnosis not present

## 2020-11-12 HISTORY — DX: Pneumonia, unspecified organism: J18.9

## 2020-11-12 LAB — COMPREHENSIVE METABOLIC PANEL
ALT: 32 U/L (ref 0–44)
AST: 24 U/L (ref 15–41)
Albumin: 3.7 g/dL (ref 3.5–5.0)
Alkaline Phosphatase: 78 U/L (ref 38–126)
Anion gap: 8 (ref 5–15)
BUN: 14 mg/dL (ref 8–23)
CO2: 28 mmol/L (ref 22–32)
Calcium: 9 mg/dL (ref 8.9–10.3)
Chloride: 104 mmol/L (ref 98–111)
Creatinine, Ser: 1.1 mg/dL — ABNORMAL HIGH (ref 0.44–1.00)
GFR, Estimated: 54 mL/min — ABNORMAL LOW (ref >60)
Glucose, Bld: 97 mg/dL (ref 70–99)
Potassium: 4.2 mmol/L (ref 3.5–5.1)
Sodium: 140 mmol/L (ref 135–145)
Total Bilirubin: 0.4 mg/dL (ref 0.3–1.2)
Total Protein: 7 g/dL (ref 6.5–8.1)

## 2020-11-12 LAB — CBC
HCT: 37.2 % (ref 36.0–46.0)
Hemoglobin: 11.6 g/dL — ABNORMAL LOW (ref 12.0–15.0)
MCH: 25.4 pg — ABNORMAL LOW (ref 26.0–34.0)
MCHC: 31.2 g/dL (ref 30.0–36.0)
MCV: 81.6 fL (ref 80.0–100.0)
Platelets: 367 10*3/uL (ref 150–400)
RBC: 4.56 MIL/uL (ref 3.87–5.11)
RDW: 14.3 % (ref 11.5–15.5)
WBC: 7.5 10*3/uL (ref 4.0–10.5)
nRBC: 0 % (ref 0.0–0.2)

## 2020-11-12 LAB — URINALYSIS, ROUTINE W REFLEX MICROSCOPIC
Bilirubin Urine: NEGATIVE
Glucose, UA: NEGATIVE mg/dL
Hgb urine dipstick: NEGATIVE
Ketones, ur: NEGATIVE mg/dL
Nitrite: NEGATIVE
Protein, ur: NEGATIVE mg/dL
Specific Gravity, Urine: 1.017 (ref 1.005–1.030)
pH: 5 (ref 5.0–8.0)

## 2020-11-12 LAB — SURGICAL PCR SCREEN
MRSA, PCR: NEGATIVE
Staphylococcus aureus: NEGATIVE

## 2020-11-12 LAB — SARS CORONAVIRUS 2 (TAT 6-24 HRS): SARS Coronavirus 2: NEGATIVE

## 2020-11-12 LAB — TYPE AND SCREEN
ABO/RH(D): B POS
Antibody Screen: NEGATIVE

## 2020-11-12 NOTE — Progress Notes (Signed)
Surgical Instructions    Your procedure is scheduled on 11/16/20.  Report to [facility] Main Entrance "A" at 06:45 A.M., then check in with the Admitting office.  Call this number if you have problems the morning of surgery:  [phone]   If you have any questions prior to your surgery date call [phone]: Open Monday-Friday 8am-4pm    Remember:  Do not eat after midnight the night before your surgery  You may drink clear liquids until 05:45am the morning of your surgery.   Clear liquids allowed are: Water, Non-Citrus Juices (without pulp), Carbonated Beverages, Clear Tea, Black Coffee Only, and Gatorade  Patient Instructions  . The night before surgery:  o No food after midnight. ONLY clear liquids after midnight  . The day of surgery (if you do NOT have diabetes):  o Drink ONE (1) Pre-Surgery Clear Ensure by 5:45 AM the morning of surgery. Drink in one sitting. Do not sip.  o This drink was given to you during your hospital  pre-op appointment visit.  o Nothing else to drink after completing the  Pre-Surgery Clear Ensure.         If you have questions, please contact your surgeon's office.     Take these medicines the morning of surgery with A SIP OF WATER  acetaminophen (TYLENOL) if needed for pain amLODipine (NORVASC) busPIRone (BUSPAR) if needed for anxiety metoprolol succinate (TOPROL-XL)    As of today, STOP taking any Aspirin (unless otherwise instructed by your surgeon) Aleve, Naproxen, Ibuprofen, Motrin, Advil, Goody's, BC's, all herbal medications, fish oil, and all vitamins.                     Do not wear jewelry, make up, or nail polish            Do not wear lotions, powders, perfumes/colognes, or deodorant.            Do not shave 48 hours prior to surgery.              Do not bring valuables to the hospital.            [facility] is not responsible for any belongings or valuables.  Do NOT Smoke (Tobacco/Vaping) or drink Alcohol 24 hours prior to  your procedure If you use a CPAP at night, you may bring all equipment for your overnight stay.   Contacts, glasses, dentures or partials may not be worn into surgery, please bring cases for these belongings   For patients admitted to the hospital, discharge time will be determined by your treatment team.   Patients discharged the day of surgery will not be allowed to drive home, and someone needs to stay with them for 24 hours.    Special instructions:   [place]- Preparing For Surgery  Before surgery, you can play an important role. Because skin is not sterile, your skin needs to be as free of germs as possible. You can reduce the number of germs on your skin by washing with CHG (chlorahexidine gluconate) Soap before surgery.  CHG is an antiseptic cleaner which kills germs and bonds with the skin to continue killing germs even after washing.    Oral Hygiene is also important to reduce your risk of infection.  Remember - BRUSH YOUR TEETH THE MORNING OF SURGERY WITH YOUR REGULAR TOOTHPASTE  Please do not use if you have an allergy to CHG or antibacterial soaps. If your skin becomes reddened/irritated stop using the  CHG.  Do not shave (including legs and underarms) for at least 48 hours prior to first CHG shower. It is OK to shave your face.  Please follow these instructions carefully.   1. Shower the NIGHT BEFORE SURGERY and the MORNING OF SURGERY  2. If you chose to wash your hair, wash your hair first as usual with your normal shampoo.  3. After you shampoo, rinse your hair and body thoroughly to remove the shampoo.  4. Wash Face and genitals (private parts) with your normal soap.   5.  Shower the NIGHT BEFORE SURGERY and the MORNING OF SURGERY with CHG Soap.   6. Use CHG Soap as you would any other liquid soap. You can apply CHG directly to the skin and wash gently with a scrungie or a clean washcloth.   7. Apply the CHG Soap to your body ONLY FROM THE NECK DOWN.  Do not use  on open wounds or open sores. Avoid contact with your eyes, ears, mouth and genitals (private parts). Wash Face and genitals (private parts)  with your normal soap.   8. Wash thoroughly, paying special attention to the area where your surgery will be performed.  9. Thoroughly rinse your body with warm water from the neck down.  10. DO NOT shower/wash with your normal soap after using and rinsing off the CHG Soap.  11. Pat yourself dry with a CLEAN TOWEL.  12. Wear CLEAN PAJAMAS to bed the night before surgery  13. Place CLEAN SHEETS on your bed the night before your surgery  14. DO NOT SLEEP WITH PETS.   Day of Surgery: Take a shower with CHG soap. Wear Clean/Comfortable clothing the morning of surgery Do not apply any deodorants/lotions.   Remember to brush your teeth WITH YOUR REGULAR TOOTHPASTE.   Please read over the following fact sheets that you were given.

## 2020-11-12 NOTE — Progress Notes (Signed)
Patient scheduled for surgery 11/16/20 with Dr. Lorin Mercy. Abnormal U/A today. Staff message sent to Dr. Lorin Mercy, and copied in Benjiman Core, [place] about U/A result.  Also called and left a voicemail with Baird Lyons- Dr. Lorin Mercy scheduler at [facility] to make aware of abnormal U/A.

## 2020-11-12 NOTE — Telephone Encounter (Signed)
Natalie left message wanting to make sure Dr. Lorin Mercy sees abnormal preop U/A.

## 2020-11-12 NOTE — Telephone Encounter (Signed)
Yes he did, had multiple UA in past and culture, multiple organisms, etc .  She will get pre-op ABX before surgery  so OK to proceed.

## 2020-11-12 NOTE — Progress Notes (Addendum)
PCP -  Dr. Lucianne Lei Cardiologist - Drl Rogers Seeds  PPM/ICD - n/a Device Orders -  Rep Notified -   Chest x-ray - n/a EKG - 11/12/20 Stress Test - denies ECHO - 10/21/20 Cardiac Cath - denies  Sleep Study - denies CPAP - n/a  ERAS Protcol - yes PRE-SURGERY Ensure or G2- Ensure  COVID TEST- 11/12/20. Patient is aware to quarantine until day of surgery. Verbalized understanding.    Anesthesia review: Yes. EKG. Abnormal U/A. Inbasket  sent to Dr. Lorin Mercy and Benjiman Core, PA and left voicemail with Baird Lyons at [facility].   Patient denies shortness of breath, fever, cough and chest pain at PAT appointment   All instructions explained to the patient, with a verbal understanding of the material. Patient agrees to go over the instructions while at home for a better understanding. Patient also instructed to self quarantine after being tested for COVID-19. The opportunity to ask questions was provided.

## 2020-11-13 NOTE — Anesthesia Preprocedure Evaluation (Addendum)
Anesthesia Evaluation  Patient identified by MRN, date of birth, ID band Patient awake    Reviewed: Allergy & Precautions, NPO status , Patient's Chart, lab work & pertinent test results  Airway Mallampati: II  TM Distance: >3 FB Neck ROM: Full    Dental  (+) Partial Lower, Partial Upper, Dental Advisory Given   Pulmonary former smoker,    breath sounds clear to auscultation       Cardiovascular hypertension,  Rhythm:Regular Rate:Normal     Neuro/Psych    GI/Hepatic   Endo/Other    Renal/GU      Musculoskeletal   Abdominal   Peds  Hematology   Anesthesia Other Findings   Reproductive/Obstetrics                            Anesthesia Physical Anesthesia Plan  ASA: III  Anesthesia Plan: MAC and Spinal   Post-op Pain Management:  Regional for Post-op pain   Induction: Intravenous  PONV Risk Score and Plan:   Airway Management Planned: Natural Airway and Simple Face Mask  Additional Equipment:   Intra-op Plan:   Post-operative Plan:   Informed Consent: I have reviewed the patients History and Physical, chart, labs and discussed the procedure including the risks, benefits and alternatives for the proposed anesthesia with the patient or authorized representative who has indicated his/her understanding and acceptance.     Dental advisory given  Plan Discussed with: CRNA and Anesthesiologist  Anesthesia Plan Comments: (See APP note by Durel Salts, FNP)       Anesthesia Quick Evaluation

## 2020-11-13 NOTE — Progress Notes (Signed)
70 year old female with history of end-stage DJD right knee and pain comes in for preop evaluation.  States that knee symptoms unchanged from previous visit.  She is wanting to proceed with right total knee replacement as scheduled.  Today history and physical performed.  Review of systems negative.  Surgical procedure discussed.  All questions answered.

## 2020-11-13 NOTE — Progress Notes (Signed)
Anesthesia Chart Review:   Case: [id number] Date/Time: 11/16/20 0830   Procedure: RIGHT TOTAL KNEE ARTHROPLASTY (Right Knee)   Anesthesia type: Spinal   Pre-op diagnosis: right knee osteoarthritis   Location: [facility] OR ROOM 05 / [facility]   Surgeons: Marybelle Killings, MD      DISCUSSION: Pt is 70 years old with hx HTN, irregular heart rate (no other documentation of this - EKG shows NSR with RBBB, echo shows normal heart function)   VS: BP (!) 163/75   Pulse 79   Temp 37.2 C (Oral)   Resp 18   Ht 5\' 4"  (1.626 m)   Wt 89.4 kg   SpO2 100%   BMI 33.84 kg/m    PROVIDERS: - PCP is Pamela Lei, MD who has cleared pt for surgery -  Saw cardiologist Pamela Chiquito, MD on 10/02/20 for pre-op eval. Echo ordered, showed normal heart function. Pt cleared for surgery at acceptable risk    LABS: Labs reviewed: Acceptable for surgery. (all labs ordered are listed, but only abnormal results are displayed)  Labs Reviewed  CBC - Abnormal; Notable for the following components:      Result Value   Hemoglobin 11.6 (*)    MCH 25.4 (*)    All other components within normal limits  COMPREHENSIVE METABOLIC PANEL - Abnormal; Notable for the following components:   Creatinine, Ser 1.10 (*)    GFR, Estimated 54 (*)    All other components within normal limits  URINALYSIS, ROUTINE W REFLEX MICROSCOPIC - Abnormal; Notable for the following components:   APPearance HAZY (*)    Leukocytes,Ua MODERATE (*)    Bacteria, UA RARE (*)    Non Squamous Epithelial 0-5 (*)    All other components within normal limits  SURGICAL PCR SCREEN  SARS CORONAVIRUS 2 (TAT 6-24 HRS)  TYPE AND SCREEN     EKG 11/12/20: NSR. RBBB.    CV: Echo 10/21/20:  1. Left ventricular ejection fraction, by estimation, is 70 to 75%. The left ventricle has hyperdynamic function. The left ventricle has no regional wall motion abnormalities. There is moderate left ventricular hypertrophy. Left ventricular diastolic parameters are consistent  with Grade I diastolic dysfunction (impaired relaxation).  2. Right ventricular systolic function is normal. The right ventricular size is normal.  3. The mitral valve is normal in structure. No evidence of mitral valve regurgitation. No evidence of mitral stenosis.  4. The aortic valve was not well visualized. Aortic valve regurgitation is not visualized. No aortic stenosis is present.    Past Medical History:  Diagnosis Date  . Arthritis   . Hyperlipidemia    borderline  . Hypertension   . Irregular heart rate   . Pneumonia    2000 per patient  . Vaginal discharge 06/05/2014    Past Surgical History:  Procedure Laterality Date  . APPENDECTOMY    . CHOLECYSTECTOMY     30 years ago  . COLONOSCOPY  03/01/2011   Procedure: COLONOSCOPY;  Surgeon: Dorothyann Peng, MD;  Location: [facility] ENDO SUITE;  Service: Endoscopy;  Laterality: N/A;  . TEMPOROMANDIBULAR JOINT SURGERY  1993    MEDICATIONS: . acetaminophen (TYLENOL) 650 MG CR tablet  . amLODipine (NORVASC) 5 MG tablet  . busPIRone (BUSPAR) 5 MG tablet  . D3-50 1.25 MG (50000 UT) capsule  . metoprolol succinate (TOPROL-XL) 25 MG 24 hr tablet  . naproxen (NAPROSYN) 500 MG tablet   No current facility-administered medications for this encounter.    If no changes,  I anticipate pt can proceed with surgery as scheduled.   Willeen Cass, PhD, FNP-BC [facility] Short Stay Surgical Center/Anesthesiology Phone: [phone] 11/13/2020 12:21 PM

## 2020-11-16 ENCOUNTER — Ambulatory Visit (HOSPITAL_COMMUNITY): Payer: Medicare HMO | Admitting: Anesthesiology

## 2020-11-16 ENCOUNTER — Encounter (HOSPITAL_COMMUNITY)
Admission: RE | Disposition: A | Discharge status: Home / Self Care | Payer: Self-pay | Source: Home / Self Care | Attending: Orthopaedic Surgery

## 2020-11-16 ENCOUNTER — Encounter (HOSPITAL_COMMUNITY): Payer: Self-pay | Admitting: Orthopaedic Surgery

## 2020-11-16 ENCOUNTER — Observation Stay (HOSPITAL_COMMUNITY)
Admission: RE | Admit: 2020-11-16 | Discharge: 2020-11-19 | Disposition: A | Discharge status: Home / Self Care | Payer: Medicare HMO | Attending: Orthopaedic Surgery | Admitting: Orthopaedic Surgery

## 2020-11-16 ENCOUNTER — Ambulatory Visit (HOSPITAL_COMMUNITY): Payer: Medicare HMO | Admitting: Emergency Medicine

## 2020-11-16 ENCOUNTER — Observation Stay (HOSPITAL_COMMUNITY): Payer: Medicare HMO

## 2020-11-16 ENCOUNTER — Other Ambulatory Visit: Payer: Self-pay

## 2020-11-16 DIAGNOSIS — G8918 Other acute postprocedural pain: Secondary | ICD-10-CM | POA: Diagnosis not present

## 2020-11-16 DIAGNOSIS — I10 Essential (primary) hypertension: Secondary | ICD-10-CM | POA: Diagnosis not present

## 2020-11-16 DIAGNOSIS — E785 Hyperlipidemia, unspecified: Secondary | ICD-10-CM | POA: Diagnosis not present

## 2020-11-16 DIAGNOSIS — Z96651 Presence of right artificial knee joint: Secondary | ICD-10-CM | POA: Diagnosis not present

## 2020-11-16 DIAGNOSIS — Z09 Encounter for follow-up examination after completed treatment for conditions other than malignant neoplasm: Secondary | ICD-10-CM

## 2020-11-16 DIAGNOSIS — Z79899 Other long term (current) drug therapy: Secondary | ICD-10-CM | POA: Diagnosis not present

## 2020-11-16 DIAGNOSIS — Z9889 Other specified postprocedural states: Secondary | ICD-10-CM | POA: Diagnosis not present

## 2020-11-16 DIAGNOSIS — M1711 Unilateral primary osteoarthritis, right knee: Secondary | ICD-10-CM | POA: Diagnosis not present

## 2020-11-16 DIAGNOSIS — Z87891 Personal history of nicotine dependence: Secondary | ICD-10-CM | POA: Diagnosis not present

## 2020-11-16 DIAGNOSIS — Z471 Aftercare following joint replacement surgery: Secondary | ICD-10-CM | POA: Diagnosis not present

## 2020-11-16 HISTORY — PX: TOTAL KNEE ARTHROPLASTY: SHX125

## 2020-11-16 LAB — ABO/RH: ABO/RH(D): B POS

## 2020-11-16 SURGERY — ARTHROPLASTY, KNEE, TOTAL
Anesthesia: Monitor Anesthesia Care | Site: Knee | Laterality: Right

## 2020-11-16 MED ORDER — CEFAZOLIN SODIUM-DEXTROSE 2-4 GM/100ML-% IV SOLN
2.0000 g | INTRAVENOUS | Status: AC
  Administered 2020-11-16: 2 g via INTRAVENOUS
  Filled 2020-11-16: qty 100

## 2020-11-16 MED ORDER — FENTANYL CITRATE (PF) 100 MCG/2ML IJ SOLN: 50.0000 ug | Freq: Once | INTRAMUSCULAR | Status: AC

## 2020-11-16 MED ORDER — PHENOL 1.4 % MT LIQD: 1.0000 | OROMUCOSAL | Status: DC | PRN

## 2020-11-16 MED ORDER — BUPIVACAINE HCL (PF) 0.25 % IJ SOLN
INTRAMUSCULAR | Status: DC | PRN
  Administered 2020-11-16: 20 mL

## 2020-11-16 MED ORDER — MIDAZOLAM HCL 2 MG/2ML IJ SOLN
INTRAMUSCULAR | Status: AC
  Filled 2020-11-16: qty 2

## 2020-11-16 MED ORDER — BUPIVACAINE LIPOSOME 1.3 % IJ SUSP
INTRAMUSCULAR | Status: AC
  Filled 2020-11-16: qty 20

## 2020-11-16 MED ORDER — BUPIVACAINE HCL (PF) 0.25 % IJ SOLN
INTRAMUSCULAR | Status: AC
  Filled 2020-11-16: qty 30

## 2020-11-16 MED ORDER — PROPOFOL 10 MG/ML IV BOLUS
INTRAVENOUS | Status: AC
  Filled 2020-11-16: qty 20

## 2020-11-16 MED ORDER — MIDAZOLAM HCL 2 MG/2ML IJ SOLN
INTRAMUSCULAR | Status: AC
  Administered 2020-11-16: 1 mg via INTRAVENOUS
  Filled 2020-11-16: qty 2

## 2020-11-16 MED ORDER — OXYCODONE HCL 5 MG/5ML PO SOLN: 5.0000 mg | Freq: Once | ORAL | Status: DC | PRN

## 2020-11-16 MED ORDER — PHENYLEPHRINE 40 MCG/ML (10ML) SYRINGE FOR IV PUSH (FOR BLOOD PRESSURE SUPPORT)
PREFILLED_SYRINGE | INTRAVENOUS | Status: AC
  Filled 2020-11-16: qty 10

## 2020-11-16 MED ORDER — HYDROMORPHONE HCL 1 MG/ML IJ SOLN
1.0000 mg | INTRAMUSCULAR | Status: DC | PRN
  Administered 2020-11-16 – 2020-11-17 (×2): 1 mg via INTRAVENOUS
  Filled 2020-11-16 (×2): qty 1

## 2020-11-16 MED ORDER — METOCLOPRAMIDE HCL 5 MG/ML IJ SOLN
5.0000 mg | Freq: Three times a day (TID) | INTRAMUSCULAR | Status: DC | PRN
Start: 2020-11-16 — End: 2020-11-19

## 2020-11-16 MED ORDER — ACETAMINOPHEN 325 MG PO TABS: 325.0000 mg | ORAL_TABLET | Freq: Four times a day (QID) | ORAL | Status: DC | PRN

## 2020-11-16 MED ORDER — ASPIRIN EC 325 MG PO TBEC
325.0000 mg | DELAYED_RELEASE_TABLET | Freq: Every day | ORAL | 0 refills | Status: DC
Start: 2020-11-16 — End: 2022-05-04

## 2020-11-16 MED ORDER — FENTANYL CITRATE (PF) 100 MCG/2ML IJ SOLN
INTRAMUSCULAR | Status: AC
  Administered 2020-11-16: 50 ug via INTRAVENOUS
  Filled 2020-11-16: qty 2

## 2020-11-16 MED ORDER — ASPIRIN EC 325 MG PO TBEC
325.0000 mg | DELAYED_RELEASE_TABLET | Freq: Every day | ORAL | Status: DC
  Administered 2020-11-17 – 2020-11-19 (×3): 325 mg via ORAL
  Filled 2020-11-16 (×3): qty 1

## 2020-11-16 MED ORDER — PROPOFOL 10 MG/ML IV BOLUS
INTRAVENOUS | Status: DC | PRN
  Administered 2020-11-16: 20 mg via INTRAVENOUS
  Administered 2020-11-16: 15 mg via INTRAVENOUS

## 2020-11-16 MED ORDER — FENTANYL CITRATE (PF) 100 MCG/2ML IJ SOLN: 25.0000 ug | INTRAMUSCULAR | Status: DC | PRN

## 2020-11-16 MED ORDER — LIDOCAINE 2% (20 MG/ML) 5 ML SYRINGE
INTRAMUSCULAR | Status: DC | PRN
  Administered 2020-11-16: 40 mg via INTRAVENOUS

## 2020-11-16 MED ORDER — TRANEXAMIC ACID-NACL 1000-0.7 MG/100ML-% IV SOLN
INTRAVENOUS | Status: AC
  Filled 2020-11-16: qty 100

## 2020-11-16 MED ORDER — ONDANSETRON HCL 4 MG PO TABS
4.0000 mg | ORAL_TABLET | Freq: Four times a day (QID) | ORAL | Status: DC | PRN
  Administered 2020-11-19: 4 mg via ORAL
  Filled 2020-11-16 (×2): qty 1

## 2020-11-16 MED ORDER — PHENYLEPHRINE HCL-NACL 10-0.9 MG/250ML-% IV SOLN
INTRAVENOUS | Status: DC | PRN
  Administered 2020-11-16: 30 ug/min via INTRAVENOUS

## 2020-11-16 MED ORDER — METOPROLOL SUCCINATE ER 25 MG PO TB24
25.0000 mg | ORAL_TABLET | Freq: Every day | ORAL | Status: DC
  Administered 2020-11-17 – 2020-11-19 (×3): 25 mg via ORAL
  Filled 2020-11-16 (×3): qty 1

## 2020-11-16 MED ORDER — FENTANYL CITRATE (PF) 250 MCG/5ML IJ SOLN
INTRAMUSCULAR | Status: AC
  Filled 2020-11-16: qty 5

## 2020-11-16 MED ORDER — CHLORHEXIDINE GLUCONATE 0.12 % MT SOLN
15.0000 mL | Freq: Once | OROMUCOSAL | Status: AC
  Administered 2020-11-16: 15 mL via OROMUCOSAL
  Filled 2020-11-16: qty 15

## 2020-11-16 MED ORDER — ONDANSETRON HCL 4 MG/2ML IJ SOLN
INTRAMUSCULAR | Status: DC | PRN
  Administered 2020-11-16: 4 mg via INTRAVENOUS

## 2020-11-16 MED ORDER — PROPOFOL 500 MG/50ML IV EMUL
INTRAVENOUS | Status: DC | PRN
  Administered 2020-11-16: 40 ug/kg/min via INTRAVENOUS

## 2020-11-16 MED ORDER — AMLODIPINE BESYLATE 5 MG PO TABS
5.0000 mg | ORAL_TABLET | Freq: Every day | ORAL | Status: DC
  Administered 2020-11-17 – 2020-11-19 (×3): 5 mg via ORAL
  Filled 2020-11-16 (×3): qty 1

## 2020-11-16 MED ORDER — OXYCODONE HCL 5 MG PO TABS
5.0000 mg | ORAL_TABLET | ORAL | Status: DC | PRN
  Administered 2020-11-16 – 2020-11-19 (×10): 10 mg via ORAL
  Filled 2020-11-16 (×10): qty 2

## 2020-11-16 MED ORDER — ONDANSETRON HCL 4 MG/2ML IJ SOLN: 4.0000 mg | Freq: Once | INTRAMUSCULAR | Status: DC | PRN

## 2020-11-16 MED ORDER — BUPIVACAINE LIPOSOME 1.3 % IJ SUSP: 20.0000 mL | Freq: Once | INTRAMUSCULAR | Status: DC

## 2020-11-16 MED ORDER — ONDANSETRON HCL 4 MG/2ML IJ SOLN
INTRAMUSCULAR | Status: AC
  Filled 2020-11-16: qty 2

## 2020-11-16 MED ORDER — TRANEXAMIC ACID-NACL 1000-0.7 MG/100ML-% IV SOLN
INTRAVENOUS | Status: DC | PRN
  Administered 2020-11-16: 1000 mg via INTRAVENOUS

## 2020-11-16 MED ORDER — LACTATED RINGERS IV SOLN: INTRAVENOUS | Status: DC

## 2020-11-16 MED ORDER — ACETAMINOPHEN 500 MG PO TABS
ORAL_TABLET | ORAL | Status: AC
  Administered 2020-11-16: 1000 mg via ORAL
  Filled 2020-11-16: qty 2

## 2020-11-16 MED ORDER — ORAL CARE MOUTH RINSE: 15.0000 mL | Freq: Once | OROMUCOSAL | Status: AC

## 2020-11-16 MED ORDER — LIDOCAINE 2% (20 MG/ML) 5 ML SYRINGE
INTRAMUSCULAR | Status: AC
  Filled 2020-11-16: qty 5

## 2020-11-16 MED ORDER — BUPIVACAINE LIPOSOME 1.3 % IJ SUSP
INTRAMUSCULAR | Status: DC | PRN
  Administered 2020-11-16: 20 mL

## 2020-11-16 MED ORDER — OXYCODONE-ACETAMINOPHEN 5-325 MG PO TABS: 1.0000 | ORAL_TABLET | ORAL | 0 refills | Status: DC | PRN

## 2020-11-16 MED ORDER — ACETAMINOPHEN 500 MG PO TABS: 1000.0000 mg | ORAL_TABLET | Freq: Once | ORAL | Status: AC

## 2020-11-16 MED ORDER — OXYCODONE HCL 5 MG PO TABS: 5.0000 mg | ORAL_TABLET | ORAL | Status: DC | PRN

## 2020-11-16 MED ORDER — OXYCODONE HCL 5 MG PO TABS: 5.0000 mg | ORAL_TABLET | Freq: Once | ORAL | Status: DC | PRN

## 2020-11-16 MED ORDER — MIDAZOLAM HCL 2 MG/2ML IJ SOLN: 1.0000 mg | Freq: Once | INTRAMUSCULAR | Status: AC

## 2020-11-16 MED ORDER — SODIUM CHLORIDE 0.9 % IR SOLN
Status: DC | PRN
  Administered 2020-11-16: 1000 mL

## 2020-11-16 MED ORDER — METHOCARBAMOL 500 MG PO TABS
500.0000 mg | ORAL_TABLET | Freq: Four times a day (QID) | ORAL | Status: DC | PRN
  Administered 2020-11-16 (×2): 500 mg via ORAL
  Filled 2020-11-16 (×2): qty 1

## 2020-11-16 MED ORDER — PHENYLEPHRINE 40 MCG/ML (10ML) SYRINGE FOR IV PUSH (FOR BLOOD PRESSURE SUPPORT)
PREFILLED_SYRINGE | INTRAVENOUS | Status: DC | PRN
  Administered 2020-11-16 (×3): 80 ug via INTRAVENOUS

## 2020-11-16 MED ORDER — DOCUSATE SODIUM 100 MG PO CAPS
100.0000 mg | ORAL_CAPSULE | Freq: Two times a day (BID) | ORAL | Status: DC
  Administered 2020-11-16 – 2020-11-19 (×7): 100 mg via ORAL
  Filled 2020-11-16 (×7): qty 1

## 2020-11-16 MED ORDER — SODIUM CHLORIDE 0.9 % IV SOLN: INTRAVENOUS | Status: DC

## 2020-11-16 MED ORDER — METHOCARBAMOL 500 MG PO TABS: 500.0000 mg | ORAL_TABLET | Freq: Four times a day (QID) | ORAL | 0 refills | Status: DC | PRN

## 2020-11-16 MED ORDER — METHOCARBAMOL 1000 MG/10ML IJ SOLN
500.0000 mg | Freq: Four times a day (QID) | INTRAVENOUS | Status: DC | PRN
  Filled 2020-11-16: qty 5

## 2020-11-16 MED ORDER — MENTHOL 3 MG MT LOZG: 1.0000 | LOZENGE | OROMUCOSAL | Status: DC | PRN

## 2020-11-16 MED ORDER — ONDANSETRON HCL 4 MG/2ML IJ SOLN
4.0000 mg | Freq: Four times a day (QID) | INTRAMUSCULAR | Status: DC | PRN
  Administered 2020-11-16: 4 mg via INTRAVENOUS
  Filled 2020-11-16: qty 2

## 2020-11-16 MED ORDER — METOCLOPRAMIDE HCL 5 MG PO TABS
5.0000 mg | ORAL_TABLET | Freq: Three times a day (TID) | ORAL | Status: DC | PRN
Start: 2020-11-16 — End: 2020-11-19

## 2020-11-16 MED ORDER — BUSPIRONE HCL 5 MG PO TABS: 5.0000 mg | ORAL_TABLET | Freq: Every day | ORAL | Status: DC | PRN

## 2020-11-16 SURGICAL SUPPLY — 70 items
ATTUNE PS FEM RT SZ 4 CEM KNEE (Femur) ×2 IMPLANT
ATTUNE PSRP INSR SZ4 6 KNEE (Insert) ×2 IMPLANT
BANDAGE ESMARK 6X9 LF (GAUZE/BANDAGES/DRESSINGS) ×1 IMPLANT
BASEPLATE TIBIAL ROTATING SZ 4 (Knees) ×2 IMPLANT
BENZOIN TINCTURE PRP APPL 2/3 (GAUZE/BANDAGES/DRESSINGS) ×2 IMPLANT
BLADE SAGITTAL 25.0X1.19X90 (BLADE) ×2 IMPLANT
BLADE SAW SGTL 13X75X1.27 (BLADE) ×2 IMPLANT
BNDG ELASTIC 4X5.8 VLCR STR LF (GAUZE/BANDAGES/DRESSINGS) ×2 IMPLANT
BNDG ELASTIC 6X10 VLCR STRL LF (GAUZE/BANDAGES/DRESSINGS) ×2 IMPLANT
BNDG ESMARK 6X9 LF (GAUZE/BANDAGES/DRESSINGS) ×2
BOWL SMART MIX CTS (DISPOSABLE) ×2 IMPLANT
CEMENT HV SMART SET (Cement) ×4 IMPLANT
COVER SURGICAL LIGHT HANDLE (MISCELLANEOUS) ×2 IMPLANT
COVER WAND RF STERILE (DRAPES) ×2 IMPLANT
CUFF TOURN SGL QUICK 34 (TOURNIQUET CUFF) ×2
CUFF TRNQT CYL 34X4.125X (TOURNIQUET CUFF) ×1 IMPLANT
DRAPE ORTHO SPLIT 77X108 STRL (DRAPES) ×4
DRAPE SURG ORHT 6 SPLT 77X108 (DRAPES) ×2 IMPLANT
DRAPE U-SHAPE 47X51 STRL (DRAPES) ×2 IMPLANT
DRSG AQUACEL AG ADV 3.5X10 (GAUZE/BANDAGES/DRESSINGS) ×2 IMPLANT
DRSG MEPILEX BORDER 4X12 (GAUZE/BANDAGES/DRESSINGS) ×2 IMPLANT
DRSG PAD ABDOMINAL 8X10 ST (GAUZE/BANDAGES/DRESSINGS) ×2 IMPLANT
DURAPREP 26ML APPLICATOR (WOUND CARE) ×4 IMPLANT
ELECT REM PT RETURN 9FT ADLT (ELECTROSURGICAL) ×2
ELECTRODE REM PT RTRN 9FT ADLT (ELECTROSURGICAL) ×1 IMPLANT
FACESHIELD WRAPAROUND (MASK) ×4 IMPLANT
GAUZE SPONGE 4X4 12PLY STRL (GAUZE/BANDAGES/DRESSINGS) ×2 IMPLANT
GAUZE XEROFORM 5X9 LF (GAUZE/BANDAGES/DRESSINGS) ×2 IMPLANT
GLOVE ORTHO TXT STRL SZ7.5 (GLOVE) ×4 IMPLANT
GLOVE SRG 8 PF TXTR STRL LF DI (GLOVE) ×2 IMPLANT
GLOVE SURG UNDER POLY LF SZ8 (GLOVE) ×4
GOWN STRL REUS W/ TWL LRG LVL3 (GOWN DISPOSABLE) ×1 IMPLANT
GOWN STRL REUS W/ TWL XL LVL3 (GOWN DISPOSABLE) ×1 IMPLANT
GOWN STRL REUS W/TWL 2XL LVL3 (GOWN DISPOSABLE) ×2 IMPLANT
GOWN STRL REUS W/TWL LRG LVL3 (GOWN DISPOSABLE) ×2
GOWN STRL REUS W/TWL XL LVL3 (GOWN DISPOSABLE) ×2
HANDPIECE INTERPULSE COAX TIP (DISPOSABLE) ×2
IMMOBILIZER KNEE 22 UNIV (SOFTGOODS) ×2 IMPLANT
KIT BASIN OR (CUSTOM PROCEDURE TRAY) ×2 IMPLANT
KIT TURNOVER KIT B (KITS) ×2 IMPLANT
MANIFOLD NEPTUNE II (INSTRUMENTS) ×2 IMPLANT
MARKER SKIN DUAL TIP RULER LAB (MISCELLANEOUS) ×2 IMPLANT
NEEDLE 18GX1X1/2 (RX/OR ONLY) (NEEDLE) ×2 IMPLANT
NEEDLE HYPO 25GX1X1/2 BEV (NEEDLE) ×2 IMPLANT
NS IRRIG 1000ML POUR BTL (IV SOLUTION) ×2 IMPLANT
PACK TOTAL JOINT (CUSTOM PROCEDURE TRAY) ×2 IMPLANT
PAD ARMBOARD 7.5X6 YLW CONV (MISCELLANEOUS) ×4 IMPLANT
PAD CAST 3X4 CTTN HI CHSV (CAST SUPPLIES) ×1 IMPLANT
PAD CAST 4YDX4 CTTN HI CHSV (CAST SUPPLIES) ×1 IMPLANT
PADDING CAST COTTON 3X4 STRL (CAST SUPPLIES) ×2
PADDING CAST COTTON 4X4 STRL (CAST SUPPLIES) ×2
PADDING CAST COTTON 6X4 STRL (CAST SUPPLIES) ×2 IMPLANT
PATELLA MEDIAL ATTUN 35MM KNEE (Knees) ×2 IMPLANT
PIN FIX SIGMA LCS THRD HI (PIN) ×2 IMPLANT
PIN STEINMAN FIXATION KNEE (PIN) ×2 IMPLANT
SET HNDPC FAN SPRY TIP SCT (DISPOSABLE) ×1 IMPLANT
STAPLER VISISTAT 35W (STAPLE) ×2 IMPLANT
SUCTION FRAZIER HANDLE 10FR (MISCELLANEOUS) ×2
SUCTION TUBE FRAZIER 10FR DISP (MISCELLANEOUS) ×1 IMPLANT
SUT VIC AB 0 CT1 27 (SUTURE) ×2
SUT VIC AB 0 CT1 27XBRD ANBCTR (SUTURE) ×1 IMPLANT
SUT VIC AB 1 CTX 36 (SUTURE) ×4
SUT VIC AB 1 CTX36XBRD ANBCTR (SUTURE) ×2 IMPLANT
SUT VIC AB 2-0 CT1 27 (SUTURE) ×4
SUT VIC AB 2-0 CT1 TAPERPNT 27 (SUTURE) ×2 IMPLANT
SUT VIC AB 3-0 X1 27 (SUTURE) ×2 IMPLANT
SYR 50ML LL SCALE MARK (SYRINGE) ×2 IMPLANT
SYR CONTROL 10ML LL (SYRINGE) ×2 IMPLANT
TOWEL GREEN STERILE (TOWEL DISPOSABLE) ×2 IMPLANT
TOWEL GREEN STERILE FF (TOWEL DISPOSABLE) ×2 IMPLANT

## 2020-11-16 NOTE — Anesthesia Procedure Notes (Signed)
Spinal  Patient location during procedure: OR Start time: 11/16/2020 8:50 AM End time: 11/16/2020 8:55 AM Reason for block: surgical anesthesia Staffing Performed: anesthesiologist  Anesthesiologist: Roberts Gaudy, MD Preanesthetic Checklist Completed: patient identified, IV checked, site marked, risks and benefits discussed, surgical consent, monitors and equipment checked, pre-op evaluation and timeout performed Spinal Block Patient position: sitting Prep: DuraPrep Patient monitoring: heart rate, cardiac monitor, continuous pulse ox and blood pressure Approach: midline Location: L3-4 Injection technique: single-shot Needle Needle type: Sprotte and Tuohy  Needle gauge: 22 G Needle length: 9 cm Assessment Sensory level: T6 Events: CSF return Additional Notes 1.8 cc 0.75% Bupivacaine injected easily

## 2020-11-16 NOTE — H&P (Signed)
TOTAL KNEE ADMISSION H&P  Patient is being admitted for right total knee arthroplasty.  Subjective:  Chief Complaint:right knee pain.  HPI: Pamela Walter, 70 y.o. female, has a history of pain and functional disability in the right knee due to arthritis and has failed non-surgical conservative treatments for greater than 12 weeks to includeNSAID's and/or analgesics, use of assistive devices and activity modification.  Onset of symptoms was gradual, starting 10 years ago with gradually worsening course since that time.   Patient currently rates pain in the right knee(s) at 10 out of 10 with activity. Patient has night pain, worsening of pain with activity and weight bearing, pain that interferes with activities of daily living, crepitus and joint swelling.  Patient has evidence of subchondral cysts, subchondral sclerosis, periarticular osteophytes and joint space narrowing by imaging studies.  There is no active infection.  Patient Active Problem List   Diagnosis Date Noted  . Unilateral primary osteoarthritis, right knee 03/11/2020  . H/O calcium pyrophosphate deposition disease (CPPD) 07/14/2016  . Vaginal discharge 06/05/2014   Past Medical History:  Diagnosis Date  . Arthritis   . Hyperlipidemia    borderline  . Hypertension   . Irregular heart rate   . Pneumonia    2000 per patient  . Vaginal discharge 06/05/2014    Past Surgical History:  Procedure Laterality Date  . APPENDECTOMY    . CHOLECYSTECTOMY     30 years ago  . COLONOSCOPY  03/01/2011   Procedure: COLONOSCOPY;  Surgeon: Dorothyann Peng, MD;  Location: [facility] ENDO SUITE;  Service: Endoscopy;  Laterality: N/A;  . TEMPOROMANDIBULAR JOINT SURGERY  1993    No current facility-administered medications for this encounter.   No Known Allergies  Social History   Tobacco Use  . Smoking status: Former Smoker    Packs/day: 0.50    Years: 30.00    Pack years: 15.00    Types: Cigarettes    Quit date: 2013    Years  since quitting: 9.3  . Smokeless tobacco: Never Used  Substance Use Topics  . Alcohol use: Yes    Comment: wine once a month    Family History  Problem Relation Age of Onset  . Hypertension Mother   . Diabetes Mother   . Cancer Mother   . Hypertension Father   . Heart attack Father   . Hypertension Sister   . Cancer Brother        prostate  . Diabetes Son   . Diabetes Brother   . Dementia Brother      Review of Systems  Constitutional: Positive for activity change.  HENT: Negative.   Respiratory: Negative.   Cardiovascular: Negative.   Genitourinary: Negative.   Musculoskeletal: Positive for gait problem and joint swelling.    Objective:  Physical Exam HENT:     Head: Normocephalic.  Eyes:     Extraocular Movements: Extraocular movements intact.  Cardiovascular:     Heart sounds: Normal heart sounds.  Pulmonary:     Effort: No respiratory distress.  Musculoskeletal:        General: Tenderness present.  Neurological:     General: No focal deficit present.     Mental Status: She is alert and oriented to person, place, and time.  Psychiatric:        Mood and Affect: Mood normal.     Vital signs in last 24 hours:    Labs:   Estimated body mass index is 33.84 kg/m as calculated from the  following:   Height as of 11/12/20: 5\' 4"  (1.626 m).   Weight as of 11/12/20: 89.4 kg.   Imaging Review Plain radiographs demonstrate moderate degenerative joint disease of the right knee(s). The overall alignment ismild varus. The bone quality appears to be good for age and reported activity level.      Assessment/Plan:  End stage arthritis, right knee   The patient history, physical examination, clinical judgment of the provider and imaging studies are consistent with end stage degenerative joint disease of the right knee(s) and total knee arthroplasty is deemed medically necessary. The treatment options including medical management, injection therapy arthroscopy  and arthroplasty were discussed at length. The risks and benefits of total knee arthroplasty were presented and reviewed. The risks due to aseptic loosening, infection, stiffness, patella tracking problems, thromboembolic complications and other imponderables were discussed. The patient acknowledged the explanation, agreed to proceed with the plan and consent was signed. Patient is being admitted for inpatient treatment for surgery, pain control, PT, OT, prophylactic antibiotics, VTE prophylaxis, progressive ambulation and ADL's and discharge planning. The patient is planning to be discharged home with home health services     Patient's anticipated LOS is less than 2 midnights, meeting these requirements: - Younger than 74 - Lives within 1 hour of care - Has a competent adult at home to recover with post-op recover - NO history of  - Chronic pain requiring opiods  - Diabetes  - Coronary Artery Disease  - Heart failure  - Heart attack  - Stroke  - DVT/VTE  - Cardiac arrhythmia  - Respiratory Failure/COPD  - Renal failure  - Anemia  - Advanced Liver disease

## 2020-11-16 NOTE — Op Note (Signed)
Preop diagnosis: Right knee primary osteoarthritis  Postop diagnosis: Same  Procedure: Right total knee arthroplasty.  Surgeon: Lorin Mercy MD  Assistant: Benjiman Core, PA-C medically necessary and present for the entire procedure  Anesthesia: Spinal plus Exparel and Marcaine 20+20 equals 40 cc  Tourniquet: 350 times approximately 50 minutes.  ImplantsCEMENT HV SMART SET - [id number]  Inventory Item: CEMENT HV SMART SET Serial no.:  Model/Cat no.: [id number]  Implant name: CEMENT HV SMART SET - [id number] Laterality: Right Area: Knee  Manufacturer: [place] Date of Manufacture:    Action: Implanted Number Used: 1   Device Identifier:  Device Identifier Type:     CEMENT HV SMART SET - [id number]  Inventory Item: CEMENT HV SMART SET Serial no.:  Model/Cat no.: [id number]  Implant name: CEMENT HV SMART SET - [id number] Laterality: Right Area: Knee  Manufacturer: [place] Date of Manufacture:    Action: Implanted Number Used: 1   Device Identifier:  Device Identifier Type:     BASEPLATE TIBIAL ROTATING SZ 4 - [id number]  Inventory Item: BASEPLATE TIBIAL ROTATING SZ 4 Serial no.:  Model/Cat no.: [id number]  Implant name: BASEPLATE TIBIAL ROTATING SZ 4 - [id number] Laterality: Right Area: Knee  Manufacturer: [employer] Date of Manufacture:    Action: Implanted Number Used: 1   Device Identifier:  Device Identifier Type:     ATTUNE PS FEM RT SZ 4 CEM KNEE - [id number]  Inventory Item: ATTUNE PS FEM RT SZ 4 CEM KNEE Serial no.:  Model/Cat no.: [id number]  Implant name: ATTUNE PS FEM RT SZ 4 CEM KNEE - [id number] Laterality: Right Area: Knee  Manufacturer: [employer] Date of Manufacture:    Action: Implanted Number Used: 1   Device Identifier:  Device Identifier Type:     PATELLA MEDIAL ATTUN 35MM KNEE - [id number]  Inventory Item: PATELLA MEDIAL ATTUN 35MM KNEE Serial no.:  Model/Cat no.: [id number]  Implant name: PATELLA MEDIAL ATTUN 35MM KNEE - [id number]  Laterality: Right Area: Knee  Manufacturer: [employer] Date of Manufacture:    Action: Implanted Number Used: 1   Device Identifier:  Device Identifier Type:     ATTUNE PSRP INSR SZ4 6MM KNEE - [id number]  Inventory Item: ATTUNE PSRP INSR SZ4 6MM KNEE Serial no.:  Model/Cat no.: [id number]  Implant name: ATTUNE PSRP INSR SZ4 6MM KNEE - [id number] Laterality: Right Area: Knee  Manufacturer: [place] Date of Manufacture:    Action: Implanted Number Used: 1   Device Identifier:  Device Identifier Type:     Procedure: After induction of spinal anesthesia proximal thigh tourniquet lateral post heel bump Exparel Ancef IV prophylaxis and timeout procedure patient been prepped with DuraPrep the usual impervious stockinette Coban total knee sheets drapes which sheets sterile skin marker and Betadine Steri-Drape were applied.  Leg was wrapped with Esmarch tourniquet inflated to 350.  Midline incision was made medial parapatellar incision was made.  Patella was cut resecting 10 mm.  Intramedullary hole drilled in the femur.  There is tricompartmental degenerative osteophytes with eburnated subchondral bone exposed tricompartmental.  Initially 10 mm was resected but due to patient's valgus and lateral condyle where there was minimal cut laterally soft we backed up and took an additional 2 mm for a total of 12 mm distal resection off of the medial femoral condyle.  Tibia was resected chamfer cuts and sizing for #4 for the femur.  #4 tibia.  Box cut was made and trials were inserted.  All meniscal remnants of been resected.  There was  some loose pieces posteriorly removed from the popliteal space.  Trial showed 6 mm gave good restoration with full extension and collateral balance.  Pulsatile lavage packing mixing with cement tibia was cemented first followed by femur placement of the DePuy rotating platform Attune size four 6 mm spacer.  35 mm patella was selected.  Cement was hardened 15 minutes  Exparel was injected while cement was hardening with the Marcaine mixture.  Tourniquet deflated hemostasis obtained standard layered closure #1 Vicryl deep fascia 201 subtenons tissue skin staple closure postop dressing knee immobilizer and transferred recovery room.

## 2020-11-16 NOTE — Plan of Care (Signed)
  Problem: Health Behavior/Discharge Planning: Goal: Ability to manage health-related needs will improve Outcome: Progressing   Problem: Clinical Measurements: Goal: Will remain free from infection Outcome: Progressing Goal: Diagnostic test results will improve Outcome: Progressing   

## 2020-11-16 NOTE — Evaluation (Signed)
Physical Therapy Evaluation Patient Details Name: Pamela Walter MRN: [id number] DOB: 08/04/1950 Today's Date: 11/16/2020   History of Present Illness  Pt is a 70 y/o female s/p R TKA on 5/2. PMH includes HTN.  Clinical Impression  Pt admitted secondary to problem above with deficits below. Pt requiring min A to sit at EOB. Further mobility limited secondary to nausea and pain. Anticipate pt will progress well once symptoms improve. Educated about knee precautions. Will continue to follow acutely.     Follow Up Recommendations Follow surgeon's recommendation for DC plan and follow-up therapies    Equipment Recommendations  3in1 (PT)    Recommendations for Other Services       Precautions / Restrictions Precautions Precautions: Knee Precaution Booklet Issued: No Restrictions Weight Bearing Restrictions: Yes RLE Weight Bearing: Weight bearing as tolerated      Mobility  Bed Mobility Overal bed mobility: Needs Assistance Bed Mobility: Supine to Sit;Sit to Supine     Supine to sit: Min assist Sit to supine: Min assist   General bed mobility comments: Min A for RLE assist. Upon sitting. Pt in increased pain and reporting increased nausea so returned to supine.    Transfers                    Ambulation/Gait                Stairs            Wheelchair Mobility    Modified Rankin (Stroke Patients Only)       Balance Overall balance assessment: Needs assistance Sitting-balance support: No upper extremity supported;Feet supported Sitting balance-Leahy Scale: Fair                                       Pertinent Vitals/Pain Pain Assessment: 0-10 Pain Score: 10-Worst pain ever Pain Location: R knee Pain Descriptors / Indicators: Aching;Operative site guarding Pain Intervention(s): Limited activity within patient's tolerance;Monitored during session;Repositioned    Home Living Family/patient expects to be discharged to::  Private residence Living Arrangements: Children;Other relatives (grandkids) Available Help at Discharge: Family;Available 24 hours/day Type of Home: House Home Access: Stairs to enter Entrance Stairs-Rails: None Entrance Stairs-Number of Steps: 3 Home Layout: One level Home Equipment: Walker - 2 wheels;Cane - single point      Prior Function Level of Independence: Independent with assistive device(s)         Comments: Used cane for ambulation     Hand Dominance        Extremity/Trunk Assessment   Upper Extremity Assessment Upper Extremity Assessment: Overall WFL for tasks assessed    Lower Extremity Assessment Lower Extremity Assessment: RLE deficits/detail RLE Deficits / Details: Deficits consistent with post op pain and weakness.    Cervical / Trunk Assessment Cervical / Trunk Assessment: Normal  Communication   Communication: No difficulties  Cognition Arousal/Alertness: Awake/alert Behavior During Therapy: WFL for tasks assessed/performed Overall Cognitive Status: Within Functional Limits for tasks assessed                                        General Comments      Exercises Total Joint Exercises Ankle Circles/Pumps: AROM;Both;10 reps;Supine   Assessment/Plan    PT Assessment Patient needs continued PT services  PT Problem List Decreased strength;Decreased  range of motion;Decreased activity tolerance;Decreased balance;Decreased mobility;Decreased knowledge of use of DME;Decreased knowledge of precautions;Pain       PT Treatment Interventions DME instruction;Gait training;Stair training;Functional mobility training;Therapeutic activities;Therapeutic exercise;Balance training;Patient/family education    PT Goals (Current goals can be found in the Care Plan section)  Acute Rehab PT Goals Patient Stated Goal: to feel better PT Goal Formulation: With patient Time For Goal Achievement: 11/30/20 Potential to Achieve Goals: Fair     Frequency 7X/week   Barriers to discharge        Co-evaluation               AM-PAC PT "6 Clicks" Mobility  Outcome Measure Help needed turning from your back to your side while in a flat bed without using bedrails?: None Help needed moving from lying on your back to sitting on the side of a flat bed without using bedrails?: A Little Help needed moving to and from a bed to a chair (including a wheelchair)?: A Little Help needed standing up from a chair using your arms (e.g., wheelchair or bedside chair)?: A Little Help needed to walk in hospital room?: A Little Help needed climbing 3-5 steps with a railing? : A Lot 6 Click Score: 18    End of Session   Activity Tolerance: Treatment limited secondary to medical complications (Comment);Patient limited by pain (nausea) Patient left: in bed;with call bell/phone within reach Nurse Communication: Mobility status PT Visit Diagnosis: Other abnormalities of gait and mobility (R26.89);Pain Pain - Right/Left: Right Pain - part of body: Knee    Time: 1530-1546 PT Time Calculation (min) (ACUTE ONLY): 16 min   Charges:   PT Evaluation $PT Eval Low Complexity: 1 Low          Lou Miner, DPT  Acute Rehabilitation Services  Pager: [phone] Office: [phone]   Rudean Hitt 11/16/2020, 4:44 PM

## 2020-11-16 NOTE — Anesthesia Procedure Notes (Signed)
Procedure Name: MAC Date/Time: 11/16/2020 8:48 AM Performed by: Janace Litten, CRNA Pre-anesthesia Checklist: Patient identified, Emergency Drugs available, Suction available and Patient being monitored Patient Re-evaluated:Patient Re-evaluated prior to induction Oxygen Delivery Method: Nasal cannula

## 2020-11-16 NOTE — Anesthesia Procedure Notes (Addendum)
Anesthesia Regional Block: Adductor canal block   Pre-Anesthetic Checklist: ,, timeout performed, Correct Patient, Correct Site, Correct Laterality, Correct Procedure, Correct Position, site marked, Risks and benefits discussed, pre-op evaluation,  At surgeon's request and post-op pain management  Laterality: Right  Prep: Maximum Sterile Barrier Precautions used, chloraprep       Needles:  Injection technique: Single-shot  Needle Type: Echogenic Stimulator Needle     Needle Length: 9cm  Needle Gauge: 21     Additional Needles:   Procedures:,,,, ultrasound used (permanent image in chart),,,,  Narrative:  Start time: 11/16/2020 7:45 AM End time: 11/16/2020 7:50 AM Injection made incrementally with aspirations every 5 mL.  Performed by: Personally  Anesthesiologist: Roberts Gaudy, MD  Additional Notes:  20 cc 0.5% Bupivacaine 1:200 epi 10 cc 1.3% Exparel injected easily

## 2020-11-16 NOTE — Progress Notes (Signed)
Orthopedic Tech Progress Note Patient Details:  Pamela Walter 1950/09/13 [id number] Applied when patient was in PACU CPM Right Knee CPM Right Knee: On Right Knee Flexion (Degrees): 0 Right Knee Extension (Degrees): 60  Post Interventions Patient Tolerated: Well Instructions Provided: [facility] [place] 11/16/2020, 12:23 PM

## 2020-11-16 NOTE — Interval H&P Note (Signed)
History and Physical Interval Note:  11/16/2020 8:29 AM  Pamela Walter  has presented today for surgery, with the diagnosis of right knee osteoarthritis.  The various methods of treatment have been discussed with the patient and family. After consideration of risks, benefits and other options for treatment, the patient has consented to  Procedure(s): RIGHT TOTAL KNEE ARTHROPLASTY (Right) as a surgical intervention.  The patient's history has been reviewed, patient examined, no change in status, stable for surgery.  I have reviewed the patient's chart and labs.  Questions were answered to the patient's satisfaction.     Pamela Walter

## 2020-11-16 NOTE — Anesthesia Postprocedure Evaluation (Signed)
Anesthesia Post Note  Patient: Pamela Walter  Procedure(s) Performed: RIGHT TOTAL KNEE ARTHROPLASTY (Right Knee)     Patient location during evaluation: PACU Anesthesia Type: MAC Level of consciousness: oriented and awake and alert Pain management: pain level controlled Vital Signs Assessment: post-procedure vital signs reviewed and stable Respiratory status: spontaneous breathing, respiratory function stable and patient connected to nasal cannula oxygen Cardiovascular status: blood pressure returned to baseline and stable Postop Assessment: no headache, no backache and no apparent nausea or vomiting Anesthetic complications: no   No complications documented.  Last Vitals:  Vitals:   11/16/20 1150 11/16/20 1200  BP: 120/67 137/67  Pulse: (!) 56   Resp: 12   Temp: 36.5 C (!) 36.3 C  SpO2: 97% 99%    Last Pain:  Vitals:   11/16/20 1238  TempSrc:   PainSc: 8                  Araminta Zorn COKER

## 2020-11-16 NOTE — Plan of Care (Signed)

## 2020-11-16 NOTE — Transfer of Care (Signed)
Immediate Anesthesia Transfer of Care Note  Patient: Pamela Walter  Procedure(s) Performed: RIGHT TOTAL KNEE ARTHROPLASTY (Right Knee)  Patient Location: PACU  Anesthesia Type:MAC combined with regional for post-op pain  Level of Consciousness: awake, alert  and patient cooperative  Airway & Oxygen Therapy: Patient Spontanous Breathing  Post-op Assessment: Report given to RN and Post -op Vital signs reviewed and stable  Post vital signs: Reviewed and stable  Last Vitals:  Vitals Value Taken Time  BP 118/63 11/16/20 1036  Temp    Pulse 59 11/16/20 1040  Resp 15 11/16/20 1040  SpO2 100 % 11/16/20 1040  Vitals shown include unvalidated device data.  Last Pain:  Vitals:   11/16/20 0800  TempSrc:   PainSc: 0-No pain      Patients Stated Pain Goal: 2 (21/22/48 2500)  Complications: No complications documented.

## 2020-11-17 ENCOUNTER — Encounter (HOSPITAL_COMMUNITY): Payer: Self-pay | Admitting: Orthopaedic Surgery

## 2020-11-17 DIAGNOSIS — M1711 Unilateral primary osteoarthritis, right knee: Secondary | ICD-10-CM | POA: Diagnosis not present

## 2020-11-17 DIAGNOSIS — Z79899 Other long term (current) drug therapy: Secondary | ICD-10-CM | POA: Diagnosis not present

## 2020-11-17 DIAGNOSIS — Z87891 Personal history of nicotine dependence: Secondary | ICD-10-CM | POA: Diagnosis not present

## 2020-11-17 DIAGNOSIS — I10 Essential (primary) hypertension: Secondary | ICD-10-CM | POA: Diagnosis not present

## 2020-11-17 LAB — CBC
HCT: 30.1 % — ABNORMAL LOW (ref 36.0–46.0)
Hemoglobin: 9.7 g/dL — ABNORMAL LOW (ref 12.0–15.0)
MCH: 25.5 pg — ABNORMAL LOW (ref 26.0–34.0)
MCHC: 32.2 g/dL (ref 30.0–36.0)
MCV: 79 fL — ABNORMAL LOW (ref 80.0–100.0)
Platelets: 269 10*3/uL (ref 150–400)
RBC: 3.81 MIL/uL — ABNORMAL LOW (ref 3.87–5.11)
RDW: 14.3 % (ref 11.5–15.5)
WBC: 11.3 10*3/uL — ABNORMAL HIGH (ref 4.0–10.5)
nRBC: 0 % (ref 0.0–0.2)

## 2020-11-17 LAB — BASIC METABOLIC PANEL
Anion gap: 9 (ref 5–15)
BUN: 9 mg/dL (ref 8–23)
CO2: 25 mmol/L (ref 22–32)
Calcium: 8.5 mg/dL — ABNORMAL LOW (ref 8.9–10.3)
Chloride: 101 mmol/L (ref 98–111)
Creatinine, Ser: 0.91 mg/dL (ref 0.44–1.00)
GFR, Estimated: >60 mL/min (ref >60)
Glucose, Bld: 122 mg/dL — ABNORMAL HIGH (ref 70–99)
Potassium: 4.2 mmol/L (ref 3.5–5.1)
Sodium: 135 mmol/L (ref 135–145)

## 2020-11-17 NOTE — Progress Notes (Signed)
Physical Therapy Treatment Patient Details Name: Pamela Walter MRN: [id number] DOB: [birth year] Today's Date: 11/17/2020    History of Present Illness Pt is a 70 y/o female s/p R TKA on 5/2. PMH includes HTN.    PT Comments    Pt was supine in bed on arrival. Still requires encouragement throughout treatment, but is more motivated than AM session. She has progressed gait training distances this session and would continue to benefit from further PT to increase strength and endurance. Plan to progress to stair training within pt tolerance at next session. Continue to follow surgeon's recommendation for d/c.    Follow Up Recommendations  Follow surgeon's recommendation for DC plan and follow-up therapies     Equipment Recommendations  3in1 (PT)    Recommendations for Other Services       Precautions / Restrictions Precautions Precautions: Knee Restrictions Weight Bearing Restrictions: Yes RLE Weight Bearing: Weight bearing as tolerated    Mobility  Bed Mobility Overal bed mobility: Needs Assistance Bed Mobility: Supine to Sit     Supine to sit: Supervision Sit to supine: Min assist   General bed mobility comments: Supervision for safety. Pt able to move RLE with LLE this session. Required min A once back in bed to lift RLE off of LLE.    Transfers Overall transfer level: Needs assistance Equipment used: Rolling walker (2 wheeled) Transfers: Sit to/from Stand Sit to Stand: Min assist;From elevated surface         General transfer comment: Min A to power into standing. Cues for hand placement.  Ambulation/Gait Ambulation/Gait assistance: Min assist;+2 safety/equipment (+2 for chair follow) Gait Distance (Feet): 45 Feet Assistive device: Rolling walker (2 wheeled) Gait Pattern/deviations: Step-to pattern;Decreased stride length;Trunk flexed Gait velocity: decreased   General Gait Details: Pt min a and requires VC to not lean elbows on walker and maintain  upright posture. VC's for quad contraction in stance phase.   Stairs             Wheelchair Mobility    Modified Rankin (Stroke Patients Only)       Balance Overall balance assessment: Needs assistance Sitting-balance support: No upper extremity supported;Feet supported Sitting balance-Leahy Scale: Fair     Standing balance support: Bilateral upper extremity supported Standing balance-Leahy Scale: Poor                              Cognition Arousal/Alertness: Awake/alert Behavior During Therapy: WFL for tasks assessed/performed Overall Cognitive Status: Within Functional Limits for tasks assessed                                        Exercises      General Comments General comments (skin integrity, edema, etc.): Pt continues to require encouragement throughout session.      Pertinent Vitals/Pain Pain Assessment: No/denies pain    Home Living                      Prior Function            PT Goals (current goals can now be found in the care plan section) Acute Rehab PT Goals Patient Stated Goal: to get back to exercising PT Goal Formulation: With patient Time For Goal Achievement: 11/30/20 Potential to Achieve Goals: Good Progress towards PT goals: Progressing toward goals  Frequency    7X/week      PT Plan Current plan remains appropriate    Co-evaluation              AM-PAC PT "6 Clicks" Mobility   Outcome Measure  Help needed turning from your back to your side while in a flat bed without using bedrails?: A Little Help needed moving from lying on your back to sitting on the side of a flat bed without using bedrails?: A Little Help needed moving to and from a bed to a chair (including a wheelchair)?: A Little Help needed standing up from a chair using your arms (e.g., wheelchair or bedside chair)?: A Little Help needed to walk in hospital room?: A Little Help needed climbing 3-5 steps with a  railing? : A Lot 6 Click Score: 17    End of Session Equipment Utilized During Treatment: Gait belt Activity Tolerance: Patient tolerated treatment well Patient left: in bed;with call bell/phone within reach;with bed alarm set Nurse Communication: Mobility status PT Visit Diagnosis: Other abnormalities of gait and mobility (R26.89);Pain     Time: 0347-4259 PT Time Calculation (min) (ACUTE ONLY): 25 min  Charges:  $Gait Training: 8-22 mins $Therapeutic Activity: 8-22 mins                      Sandria Manly, SPTA    Sandria Manly 11/17/2020, 5:35 PM

## 2020-11-17 NOTE — Progress Notes (Signed)
   Subjective: 1 Day Post-Op Procedure(s) (LRB): RIGHT TOTAL KNEE ARTHROPLASTY (Right) Patient reports pain as moderate and severe. Sat on edge of bed and got nauseated. Has not been up.   Objective: Vital signs in last 24 hours: Temp:  [97.4 F (36.3 C)-100.3 F (37.9 C)] 100.1 F (37.8 C) (05/03 0729) Pulse Rate:  [52-92] 92 (05/03 0729) Resp:  [11-18] 17 (05/03 0729) BP: (108-148)/(51-93) 148/58 (05/03 0729) SpO2:  [97 %-100 %] 97 % (05/03 0729)  Intake/Output from previous day: 05/02 0701 - 05/03 0700 In: 2116.4 [I.V.:1816.4; IV Piggyback:200] Out: 1450 [Urine:1400; Blood:50] Intake/Output this shift: Total I/O In: -  Out: 1350 [Urine:1350]  Recent Labs    11/17/20 0404  HGB 9.7*   Recent Labs    11/17/20 0404  WBC 11.3*  RBC 3.81*  HCT 30.1*  PLT 269   Recent Labs    11/17/20 0404  NA 135  K 4.2  CL 101  CO2 25  BUN 9  CREATININE 0.91  GLUCOSE 122*  CALCIUM 8.5*   No results for input(s): LABPT, INR in the last 72 hours.  Neurologically intact DG Knee Right Port  Result Date: 11/16/2020 CLINICAL DATA:  Postop knee arthroplasty EXAM: PORTABLE RIGHT KNEE - 1-2 VIEW COMPARISON:  Portable exam 1047 hours compared to 01/23/2006 FINDINGS: Osseous demineralization. Components of RIGHT knee prosthesis identified. No fracture, dislocation, or bone destruction. Anterior skin clips and expected postsurgical changes of soft tissues. IMPRESSION: RIGHT knee prosthesis without acute abnormalities. Electronically Signed   By: Lavonia Dana M.D.   On: 11/16/2020 11:20    Assessment/Plan: 1 Day Post-Op Procedure(s) (LRB): RIGHT TOTAL KNEE ARTHROPLASTY (Right) Up with therapy. Unlikely she will be mobile enough to go home today. Will see how she does with therapy.   Pamela Walter 11/17/2020, 8:24 AM

## 2020-11-17 NOTE — TOC Initial Note (Signed)
Transition of Care [facility]) - Initial/Assessment Note    Patient Details  Name: Pamela Walter MRN: [id number] Date of Birth: Dec 03, 1950  [facility] CM/SW Contact:    Sharin Mons, RN Phone Number: 11/17/2020, 4:22 PM  Clinical Narrative:                 Presented with R knee OA. From home with supportive family. PTA independent with ADL's. Pt already has rolling walker and shower chair @ home. States would like a 3in 1 / BSC  @ d/c.      - s/p R TKA, 5/3 NCM spoke with pt regarding d/c planning. Pt states would like to have  home health PT services when d/c. NCM offered choice. Pt without preference. Referral made with [facility] and accepted pending MD's order. Pt's address: [address], [place].   PCP: Lucianne Lei  TOC team will continue to monitor and assist with needs....  Expected Discharge Plan: [place] Barriers to Discharge: Continued Medical Work up   Patient Goals and CMS Choice Patient states their goals for this hospitalization and ongoing recovery are:: to get better   Choice offered to / list presented to : Patient  Expected Discharge Plan and Services Expected Discharge Plan: [place]   Discharge Planning Services: CM Consult Post Acute Care Choice: Home Health       HH Arranged: PT [facility] Agency: Other - See comment ([place])        Prior Living Arrangements/Services   Lives with:: Adult Children Patient language and need for interpreter reviewed:: Yes Do you feel safe going back to the place where you live?: Yes            Criminal Activity/Legal Involvement Pertinent to Current Situation/Hospitalization: No - Comment as needed  Activities of Daily Living Home Assistive Devices/Equipment: Eyeglasses,Dentures (specify type) ADL Screening (condition at time of admission) Patient's cognitive ability adequate to safely complete daily activities?: Yes Is  the patient deaf or have difficulty hearing?: No Does the patient have difficulty seeing, even when wearing glasses/contacts?: No Does the patient have difficulty concentrating, remembering, or making decisions?: No Patient able to express need for assistance with ADLs?: Yes Does the patient have difficulty dressing or bathing?: No Independently performs ADLs?: Yes (appropriate for developmental age) Does the patient have difficulty walking or climbing stairs?: Yes Weakness of Legs: Right Weakness of Arms/Hands: None  Permission Sought/Granted   Permission granted to share information with : Yes, Verbal Permission Granted              Emotional Assessment Appearance:: Appears stated age     Orientation: : Oriented to Self,Oriented to Place,Oriented to  Time,Oriented to Situation Alcohol / Substance Use: Not Applicable Psych Involvement: Outpatient Provider  Admission diagnosis:  S/P TKR (total knee replacement) using cement, right [Z96.651] Patient Active Problem List   Diagnosis Date Noted  . S/P TKR (total knee replacement) using cement, right 11/16/2020  . Unilateral primary osteoarthritis, right knee 03/11/2020  . H/O calcium pyrophosphate deposition disease (CPPD) 07/14/2016  . Vaginal discharge 06/05/2014   PCP:  Lucianne Lei, MD Pharmacy:   [id number] - [place], [place] [address] Phone: [phone] Fax: [phone]     Social Determinants of Health (SDOH) Interventions    Readmission Risk Interventions No flowsheet data found.

## 2020-11-17 NOTE — Progress Notes (Signed)
Physical Therapy Treatment Patient Details Name: Pamela Walter MRN: [id number] DOB: September 06, 1950 Today's Date: 11/17/2020    History of Present Illness Pt is a 70 y/o female s/p R TKA on 5/2. PMH includes HTN.    PT Comments    Pt received supine in bed with daughter helping her bathe. She is progressing towards all goals, however requires lots of verbal encouragement due to anticipation of pain. R knee AROM is 5-59 degrees of flexion. Educated pt on importance of knee precautions and movement. Plan to progress gait distance within pt tolerance at PM session. She would benefit from further PT to increase functional independence and strength. Continue to follow surgeon's recommendations for d/c.     Follow Up Recommendations  Follow surgeon's recommendation for DC plan and follow-up therapies     Equipment Recommendations  3in1 (PT)    Recommendations for Other Services       Precautions / Restrictions Precautions Precautions: Knee Restrictions Weight Bearing Restrictions: Yes RLE Weight Bearing: Weight bearing as tolerated    Mobility  Bed Mobility Overal bed mobility: Needs Assistance Bed Mobility: Supine to Sit     Supine to sit: Min assist     General bed mobility comments: Min A to RLE to EOB. Cues to use LLE to assist RLE to EOB. Pt reports difficult at this time so SPTA helped.    Transfers Overall transfer level: Needs assistance Equipment used: Rolling walker (2 wheeled) Transfers: Sit to/from Stand Sit to Stand: +2 physical assistance;Mod assist         General transfer comment: Mod A +2 physical assist to power into standing; Cues for hand placement, glute and quad activation to stand.  Ambulation/Gait Ambulation/Gait assistance: +2 physical assistance;Mod assist (3rd person for chair follow; Pt's daughter assisted this session.) Gait Distance (Feet): 10 Feet Assistive device: Rolling walker (2 wheeled) Gait Pattern/deviations: Step-to  pattern;Decreased stride length;Trunk flexed;Antalgic Gait velocity: decreased   General Gait Details: Pt requires lots of encourgement for activity due to anticipation of pain. Educated pt. on importance of movement for healing, edema management and prevention of post-op complications. VC's to contract R quad in stance phase and use BUE to assist. Pt's required mod A +2 this session with daughter pushing chair behind pt. Pt was encouraged with specific gait distance goals which was helpful to her.   Stairs             Wheelchair Mobility    Modified Rankin (Stroke Patients Only)       Balance Overall balance assessment: Needs assistance Sitting-balance support: No upper extremity supported;Feet supported Sitting balance-Leahy Scale: Fair     Standing balance support: Bilateral upper extremity supported Standing balance-Leahy Scale: Poor Standing balance comment: reliant on RW                            Cognition Arousal/Alertness: Awake/alert Behavior During Therapy: WFL for tasks assessed/performed Overall Cognitive Status: Within Functional Limits for tasks assessed                                 General Comments: Pt requires lots of encouragement to participate.      Exercises Total Joint Exercises Ankle Circles/Pumps: AROM;Both;Supine;20 reps Quad Sets: AROM;Right;10 reps;Supine Towel Squeeze: AROM;10 reps;Supine;Both Short Arc Quad: AAROM;Right;10 reps;Supine Heel Slides: AAROM;Right;10 reps;Supine Hip ABduction/ADduction: 10 reps;Supine;AAROM;Right (SPTA only removed friction from bed, Pt performed motion.)  Straight Leg Raises: AAROM;Right;10 reps;Supine Goniometric ROM: 5-59 degrees R Knee AROM    General Comments        Pertinent Vitals/Pain Pain Assessment: 0-10 Pain Score: 9  Pain Location: R knee Pain Descriptors / Indicators: Grimacing;Moaning;Aching Pain Intervention(s): Monitored during session;Repositioned     Home Living                      Prior Function            PT Goals (current goals can now be found in the care plan section) Acute Rehab PT Goals Patient Stated Goal: to not be in pain PT Goal Formulation: With patient Time For Goal Achievement: 11/30/20 Potential to Achieve Goals: Fair Progress towards PT goals: Progressing toward goals    Frequency    7X/week      PT Plan Current plan remains appropriate    Co-evaluation              AM-PAC PT "6 Clicks" Mobility   Outcome Measure  Help needed turning from your back to your side while in a flat bed without using bedrails?: A Little Help needed moving from lying on your back to sitting on the side of a flat bed without using bedrails?: A Little Help needed moving to and from a bed to a chair (including a wheelchair)?: A Little Help needed standing up from a chair using your arms (e.g., wheelchair or bedside chair)?: Total Help needed to walk in hospital room?: A Lot Help needed climbing 3-5 steps with a railing? : Total 6 Click Score: 13    End of Session Equipment Utilized During Treatment: Gait belt Activity Tolerance: Patient limited by pain;Patient tolerated treatment well Patient left: with call bell/phone within reach;in chair;with family/visitor present;with chair alarm set Nurse Communication: Mobility status;Patient requests pain meds PT Visit Diagnosis: Other abnormalities of gait and mobility (R26.89);Pain Pain - Right/Left: Right Pain - part of body: Knee     Time: 9373-4287 PT Time Calculation (min) (ACUTE ONLY): 35 min  Charges:  $Gait Training: 8-22 mins $Therapeutic Exercise: 8-22 mins                      Sandria Manly, Daymon Larsen 11/17/2020, 12:54 PM

## 2020-11-18 DIAGNOSIS — Z87891 Personal history of nicotine dependence: Secondary | ICD-10-CM | POA: Diagnosis not present

## 2020-11-18 DIAGNOSIS — M1711 Unilateral primary osteoarthritis, right knee: Secondary | ICD-10-CM | POA: Diagnosis not present

## 2020-11-18 DIAGNOSIS — Z79899 Other long term (current) drug therapy: Secondary | ICD-10-CM | POA: Diagnosis not present

## 2020-11-18 DIAGNOSIS — I10 Essential (primary) hypertension: Secondary | ICD-10-CM | POA: Diagnosis not present

## 2020-11-18 LAB — CBC
HCT: 32.4 % — ABNORMAL LOW (ref 36.0–46.0)
Hemoglobin: 10.4 g/dL — ABNORMAL LOW (ref 12.0–15.0)
MCH: 25.3 pg — ABNORMAL LOW (ref 26.0–34.0)
MCHC: 32.1 g/dL (ref 30.0–36.0)
MCV: 78.8 fL — ABNORMAL LOW (ref 80.0–100.0)
Platelets: 260 10*3/uL (ref 150–400)
RBC: 4.11 MIL/uL (ref 3.87–5.11)
RDW: 14.1 % (ref 11.5–15.5)
WBC: 10.3 10*3/uL (ref 4.0–10.5)
nRBC: 0 % (ref 0.0–0.2)

## 2020-11-18 MED ORDER — CALCIUM CARBONATE ANTACID 500 MG PO CHEW
1.0000 | CHEWABLE_TABLET | Freq: Four times a day (QID) | ORAL | Status: DC | PRN
  Administered 2020-11-18: 400 mg via ORAL
  Filled 2020-11-18: qty 2

## 2020-11-18 NOTE — Progress Notes (Signed)
Pt with discharge order, wants to leave in the morning of 11/19/20 because her ride is  Only available in the morning.  MD aware.

## 2020-11-18 NOTE — Progress Notes (Signed)
Physical Therapy Treatment Patient Details Name: Pamela Walter MRN: [id number] DOB: [birth year] Today's Date: 11/18/2020    History of Present Illness Pt is a 70 y/o female s/p R TKA on 5/2. PMH includes HTN.    PT Comments    Pt sitting in chair on arrival. Stated she was still tired, but agreed to therapy at this time. She continues to progress towards goals and shows in increase with her ROM (0-60 degrees R knee flx). She would benefit from further PT to increase strength and functional independence. Plan to continue with HEP and gait training at next session. Follow surgeon's recommendation at d/c.     Follow Up Recommendations  Follow surgeon's recommendation for DC plan and follow-up therapies     Equipment Recommendations  3in1 (PT)    Recommendations for Other Services       Precautions / Restrictions Precautions Precautions: Knee Restrictions Weight Bearing Restrictions: Yes RLE Weight Bearing: Weight bearing as tolerated    Mobility  Bed Mobility Overal bed mobility: Needs Assistance Bed Mobility: Sit to Supine     Supine to sit: Supervision     General bed mobility comments: Supervision for safety. Pt able to lift RLE into bed with no physical assist and no use of bedrail this session.    Transfers Overall transfer level: Needs assistance Equipment used: Rolling walker (2 wheeled) Transfers: Sit to/from Stand Sit to Stand: Min guard         General transfer comment: Min g from reclining chair. Cues for hand placement and pushing off of chair, not walker.  Ambulation/Gait Ambulation/Gait assistance: Min assist Gait Distance (Feet): 30 Feet (standing rest break cont'd 59ft. Stair trained and cont'd 60ft.) Assistive device: Rolling walker (2 wheeled) Gait Pattern/deviations: Step-to pattern;Decreased stride length;Decreased stance time - right;Antalgic Gait velocity: decreased   General Gait Details: cues for erect posture and using BUE support  to help offload RLE in stance phase. Pt required 1 standing rest break then rest before stair training. She continues ambulating after stair training, just inside of room.   Stairs Stairs: Yes Stairs assistance: Min assist;+2 physical assistance Stair Management: No rails;With walker;Backwards Number of Stairs: 3 General stair comments: Min A +2 to manage walker. Daughter assisting with walker management. Educated pt and family on technique and benefit of using a second person to assist. Handout given.   Wheelchair Mobility    Modified Rankin (Stroke Patients Only)       Balance Overall balance assessment: Needs assistance Sitting-balance support: No upper extremity supported;Feet supported Sitting balance-Leahy Scale: Fair     Standing balance support: Bilateral upper extremity supported Standing balance-Leahy Scale: Poor Standing balance comment: reliant on at least UUE support to balance. Pt able to remove one hand from walker to remove glasses and put on mask.                            Cognition Arousal/Alertness: Awake/alert Behavior During Therapy: WFL for tasks assessed/performed Overall Cognitive Status: Within Functional Limits for tasks assessed                                 General Comments: Pt motivated this session however limited due to lack of sleep.      Exercises   General Comments       Pertinent Vitals/Pain Pain Assessment: 0-10 Pain Score: 7  (8/10 after gait training)  Faces Pain Scale: Hurts little more Pain Location: R knee; increased with gait training. Pain Descriptors / Indicators: Aching;Discomfort;Grimacing Pain Intervention(s): Limited activity within patient's tolerance;Monitored during session    Home Living                      Prior Function            PT Goals (current goals can now be found in the care plan section) Acute Rehab PT Goals Patient Stated Goal: None stated this AM Potential  to Achieve Goals: Good Progress towards PT goals: Progressing toward goals    Frequency    7X/week      PT Plan Current plan remains appropriate    Co-evaluation              AM-PAC PT "6 Clicks" Mobility   Outcome Measure  Help needed turning from your back to your side while in a flat bed without using bedrails?: A Little Help needed moving from lying on your back to sitting on the side of a flat bed without using bedrails?: A Little Help needed moving to and from a bed to a chair (including a wheelchair)?: A Little Help needed standing up from a chair using your arms (e.g., wheelchair or bedside chair)?: A Little Help needed to walk in hospital room?: A Little Help needed climbing 3-5 steps with a railing? : A Little 6 Click Score: 18    End of Session Equipment Utilized During Treatment: Gait belt Activity Tolerance: Patient tolerated treatment well;Patient limited by fatigue Patient left: in bed;with call bell/phone within reach;with family/visitor present;with bed alarm set Nurse Communication: Mobility status;Patient requests pain meds;Other (comment) (Pt requesting something for indigestion) PT Visit Diagnosis: Other abnormalities of gait and mobility (R26.89);Pain Pain - Right/Left: Right Pain - part of body: Knee     Time: 1231-1311 PT Time Calculation (min) (ACUTE ONLY): 40 min  Charges:  $Gait Training: 23-37 mins $Therapeutic Activity: 8-22 mins                      Sandria Manly, SPTA    Sandria Manly 11/18/2020, 2:44 PM

## 2020-11-18 NOTE — Progress Notes (Signed)
Physical Therapy Treatment Patient Details Name: Pamela Walter MRN: [id number] DOB: 1950-10-08 Today's Date: 11/18/2020    History of Present Illness Pt is a 70 y/o female s/p R TKA on 5/2. PMH includes HTN.    PT Comments    Pt supine in bed at arrival. Reporting no sleep due to indigestion and fatigued because she has not eaten. Pt deferred stair training to PM session when pt has had time to receive meds and get sleep. Continued with bed level exercises and ambulation to chair for lunch. She continues to progress towards goals and would further benefit from continued PT to increase strength and mobility. Plan for stair and gait training at PM session. Continue following surgeon's recommendation for d/c.      Follow Up Recommendations  Follow surgeon's recommendation for DC plan and follow-up therapies     Equipment Recommendations  3in1 (PT)    Recommendations for Other Services       Precautions / Restrictions Precautions Precautions: Knee Restrictions Weight Bearing Restrictions: Yes RLE Weight Bearing: Weight bearing as tolerated    Mobility  Bed Mobility Overal bed mobility: Needs Assistance Bed Mobility: Supine to Sit     Supine to sit: Supervision     General bed mobility comments: Supervision for safety. Pt continues to improve with moving RLE to EOB with LLE.    Transfers Overall transfer level: Needs assistance Equipment used: Rolling walker (2 wheeled) Transfers: Sit to/from Stand Sit to Stand: Min guard;From elevated surface         General transfer comment: Min g for safety. Required re-education for sequencing. Pt stated "how do I do this".  Ambulation/Gait Ambulation/Gait assistance: Min assist Gait Distance (Feet): 8 Feet (to chair) Assistive device: Rolling walker (2 wheeled) Gait Pattern/deviations: Step-to pattern;Decreased stride length;Decreased stance time - right;Trunk flexed Gait velocity: decreased   General Gait Details: cues  for quad activation in stance phase.   Stairs             Wheelchair Mobility    Modified Rankin (Stroke Patients Only)       Balance Overall balance assessment: Needs assistance Sitting-balance support: No upper extremity supported;Feet supported Sitting balance-Leahy Scale: Fair     Standing balance support: Bilateral upper extremity supported Standing balance-Leahy Scale: Poor                              Cognition Arousal/Alertness: Awake/alert Behavior During Therapy: WFL for tasks assessed/performed Overall Cognitive Status: Within Functional Limits for tasks assessed                                 General Comments: Pt motivated this session however limited due to lack of sleep.      Exercises Total Joint Exercises Ankle Circles/Pumps: AROM;Both;Supine;20 reps Quad Sets: AROM;Right;10 reps;Supine Towel Squeeze: AROM;10 reps;Supine;Both Short Arc Quad: AAROM;Right;10 reps;Supine Heel Slides: AAROM;Right;10 reps;Supine Hip ABduction/ADduction: 10 reps;Supine;AAROM;Right Straight Leg Raises: AAROM;Right;10 reps;Supine    General Comments General comments (skin integrity, edema, etc.): Pt stated due to indigestion all night she did not get any sleep and she had not been given anything for breakfast so lacked energy. Defered stair training to PM session as Pt tolerates. Continued session with exercises and left her sitting in chair eating lunch.      Pertinent Vitals/Pain Pain Assessment: Faces Faces Pain Scale: Hurts little more Pain Location: R  knee Pain Descriptors / Indicators: Grimacing;Aching Pain Intervention(s): Monitored during session;Limited activity within patient's tolerance    Home Living                      Prior Function            PT Goals (current goals can now be found in the care plan section) Acute Rehab PT Goals Patient Stated Goal: None stated this AM Potential to Achieve Goals:  Good Progress towards PT goals: Progressing toward goals    Frequency    7X/week      PT Plan Current plan remains appropriate    Co-evaluation              AM-PAC PT "6 Clicks" Mobility   Outcome Measure  Help needed turning from your back to your side while in a flat bed without using bedrails?: A Little Help needed moving from lying on your back to sitting on the side of a flat bed without using bedrails?: A Little Help needed moving to and from a bed to a chair (including a wheelchair)?: A Little Help needed standing up from a chair using your arms (e.g., wheelchair or bedside chair)?: A Little Help needed to walk in hospital room?: A Little Help needed climbing 3-5 steps with a railing? : A Lot 6 Click Score: 17    End of Session Equipment Utilized During Treatment: Gait belt Activity Tolerance: Patient tolerated treatment well;Patient limited by fatigue (Pt reports no sleep or food since dinner last night.) Patient left: in chair;with call bell/phone within reach;with chair alarm set;with family/visitor present Nurse Communication: Mobility status;Other (comment) (Pt requesting something for indigestion) PT Visit Diagnosis: Other abnormalities of gait and mobility (R26.89);Pain Pain - Right/Left: Right Pain - part of body: Knee     Time: 7628-3151 PT Time Calculation (min) (ACUTE ONLY): 27 min  Charges:  $Therapeutic Exercise: 8-22 mins $Therapeutic Activity: 8-22 mins                      Sandria Manly, SPTA    Sandria Manly 11/18/2020, 1:16 PM

## 2020-11-18 NOTE — Progress Notes (Signed)
Subjective: Doing well.  Pain controlled.  Did good with therapy yesterday.     Objective: Vital signs in last 24 hours: Temp:  [97.7 F (36.5 C)-100.2 F (37.9 C)] 100.2 F (37.9 C) (05/04 0900) Pulse Rate:  [85-91] 91 (05/04 0900) Resp:  [17-18] 17 (05/04 0900) BP: (146-161)/(57-69) 154/65 (05/04 0900) SpO2:  [94 %-99 %] 96 % (05/04 0900)  Intake/Output from previous day: 05/03 0701 - 05/04 0700 In: 240 [P.O.:240] Out: 1350 [Urine:1350] Intake/Output this shift: No intake/output data recorded.  Recent Labs    11/17/20 0404 11/18/20 0102  HGB 9.7* 10.4*   Recent Labs    11/17/20 0404 11/18/20 0102  WBC 11.3* 10.3  RBC 3.81* 4.11  HCT 30.1* 32.4*  PLT 269 260   Recent Labs    11/17/20 0404  NA 135  K 4.2  CL 101  CO2 25  BUN 9  CREATININE 0.91  GLUCOSE 122*  CALCIUM 8.5*   No results for input(s): LABPT, INR in the last 72 hours.  Exam: Pleasant female, alert and oriented. NAD.  Wound looks good.  Staples intact.  No drainage or signs of infection.  Calf nontender    Assessment/Plan: Have PT work with patient now and anticipate d/c home this afternoon.  Scripts for percocet, robaxin, aspirin sent in to pharmacy.      Benjiman Core 11/18/2020, 12:16 PM

## 2020-11-18 NOTE — Plan of Care (Signed)
  Problem: Clinical Measurements: Goal: Ability to maintain clinical measurements within normal limits will improve Outcome: Progressing Goal: Will remain free from infection Outcome: Progressing   

## 2020-11-19 DIAGNOSIS — Z79899 Other long term (current) drug therapy: Secondary | ICD-10-CM | POA: Diagnosis not present

## 2020-11-19 DIAGNOSIS — Z87891 Personal history of nicotine dependence: Secondary | ICD-10-CM | POA: Diagnosis not present

## 2020-11-19 DIAGNOSIS — R531 Weakness: Secondary | ICD-10-CM | POA: Diagnosis not present

## 2020-11-19 DIAGNOSIS — M1711 Unilateral primary osteoarthritis, right knee: Secondary | ICD-10-CM | POA: Diagnosis not present

## 2020-11-19 DIAGNOSIS — I10 Essential (primary) hypertension: Secondary | ICD-10-CM | POA: Diagnosis not present

## 2020-11-19 LAB — CBC
HCT: 29.8 % — ABNORMAL LOW (ref 36.0–46.0)
Hemoglobin: 9.5 g/dL — ABNORMAL LOW (ref 12.0–15.0)
MCH: 25.3 pg — ABNORMAL LOW (ref 26.0–34.0)
MCHC: 31.9 g/dL (ref 30.0–36.0)
MCV: 79.3 fL — ABNORMAL LOW (ref 80.0–100.0)
Platelets: 248 10*3/uL (ref 150–400)
RBC: 3.76 MIL/uL — ABNORMAL LOW (ref 3.87–5.11)
RDW: 14.2 % (ref 11.5–15.5)
WBC: 8.5 10*3/uL (ref 4.0–10.5)
nRBC: 0 % (ref 0.0–0.2)

## 2020-11-19 NOTE — Progress Notes (Signed)
Subjective: Doing well.  Pain controlled.  Ready to go home.    Objective: Vital signs in last 24 hours: Temp:  [98.2 F (36.8 C)-98.8 F (37.1 C)] 98.2 F (36.8 C) (05/05 0400) Pulse Rate:  [85-103] 85 (05/05 0400) Resp:  [18-20] 18 (05/05 0400) BP: (140-143)/(57-67) 143/57 (05/05 0400) SpO2:  [98 %-99 %] 98 % (05/05 0400)  Intake/Output from previous day: 05/04 0701 - 05/05 0700 In: -  Out: 1 [Urine:1] Intake/Output this shift: No intake/output data recorded.  Recent Labs    11/17/20 0404 11/18/20 0102 11/19/20 0456  HGB 9.7* 10.4* 9.5*   Recent Labs    11/18/20 0102 11/19/20 0456  WBC 10.3 8.5  RBC 4.11 3.76*  HCT 32.4* 29.8*  PLT 260 248   Recent Labs    11/17/20 0404  NA 135  K 4.2  CL 101  CO2 25  BUN 9  CREATININE 0.91  GLUCOSE 122*  CALCIUM 8.5*   No results for input(s): LABPT, INR in the last 72 hours.  Exam: Pleasant female, alert and oriented, NAD.  Wound looks good. Staples intact.  No drainage or signs of infection.  Calf nontender.     Assessment/Plan: D/c home today.  Needs bilat ted hose as ordered yesterday.  F/u in office 2 weeks postop.       Benjiman Core 11/19/2020, 12:37 PM

## 2020-11-19 NOTE — Plan of Care (Signed)
  Problem: Education: Goal: Knowledge of General Education information will improve Description: Including pain rating scale, medication(s)/side effects and non-pharmacologic comfort measures Outcome: Adequate for Discharge   Problem: Health Behavior/Discharge Planning: Goal: Ability to manage health-related needs will improve Outcome: Adequate for Discharge   Problem: Clinical Measurements: Goal: Ability to maintain clinical measurements within normal limits will improve Outcome: Adequate for Discharge Goal: Will remain free from infection Outcome: Adequate for Discharge Goal: Diagnostic test results will improve Outcome: Adequate for Discharge Goal: Respiratory complications will improve Outcome: Adequate for Discharge Goal: Cardiovascular complication will be avoided Outcome: Adequate for Discharge   Problem: Activity: Goal: Risk for activity intolerance will decrease Outcome: Adequate for Discharge   Problem: Nutrition: Goal: Adequate nutrition will be maintained Outcome: Adequate for Discharge   Problem: Coping: Goal: Level of anxiety will decrease Outcome: Adequate for Discharge   Problem: Elimination: Goal: Will not experience complications related to bowel motility Outcome: Adequate for Discharge Goal: Will not experience complications related to urinary retention Outcome: Adequate for Discharge   Problem: Pain Managment: Goal: General experience of comfort will improve Outcome: Adequate for Discharge   Problem: Safety: Goal: Ability to remain free from injury will improve Outcome: Adequate for Discharge   Problem: Skin Integrity: Goal: Risk for impaired skin integrity will decrease Outcome: Adequate for Discharge   Problem: Acute Rehab PT Goals(only PT should resolve) Goal: Pt Will Go Supine/Side To Sit Outcome: Adequate for Discharge Goal: Pt Will Go Sit To Supine/Side Outcome: Adequate for Discharge Goal: Patient Will Transfer Sit To/From  Stand Outcome: Adequate for Discharge Goal: Pt Will Ambulate Outcome: Adequate for Discharge Goal: Pt Will Go Up/Down Stairs Outcome: Adequate for Discharge

## 2020-11-19 NOTE — Progress Notes (Signed)
Pt was given her AVS discharge summary and went over with her . IV was removed with catheter intact. 3in1 DME in room to take home with her. Pt had no further questions.

## 2020-11-19 NOTE — TOC Transition Note (Signed)
Transition of Care [facility]) - CM/SW Discharge Note   Patient Details  Name: KARMINA ZUFALL MRN: [id number] Date of Birth: [birth year]  [facility] CM/SW Contact:  Ninfa Meeker, RN Phone Number: 11/19/2020, 9:51 AM   Clinical Narrative:   Case Manager has requested that 3in1 be delivered to patient's room. HH was previously arranged, no further needs identified.      Barriers to Discharge: Continued Medical Work up   Patient Goals and CMS Choice Patient states their goals for this hospitalization and ongoing recovery are:: to get better   Choice offered to / list presented to : Patient  Discharge Placement                       Discharge Plan and Services   Discharge Planning Services: CM Consult Post Acute Care Choice: Home Health          DME Arranged: 3-N-1 DME Agency: [employer] Date DME Agency Contacted: 11/19/20 Time DME Agency Contacted: [phone] Representative spoke with at DME Agency: Freda Munro HH Arranged: PT [place] Agency: Other - See comment ([place])        Social Determinants of Health (SDOH) Interventions     Readmission Risk Interventions No flowsheet data found.

## 2020-11-19 NOTE — Progress Notes (Signed)
Physical Therapy Treatment Patient Details Name: Pamela Walter MRN: [id number] DOB: 1951-02-05 Today's Date: 11/19/2020    History of Present Illness Pt is a 70 y/o female s/p R TKA on 5/2. PMH includes HTN.    PT Comments    Pt admitted with above diagnosis. Pt has made great progress and is going home today.  Pt has completed all pertinent education and will continue with HHPT.   Pt currently with functional limitations due to balance and endurance deficits. Pt will benefit from skilled PT to increase their independence and safety with mobility to allow discharge to the venue listed below.     Follow Up Recommendations  Follow surgeon's recommendation for DC plan and follow-up therapies     Equipment Recommendations  3in1 (PT)    Recommendations for Other Services       Precautions / Restrictions Precautions Precautions: Knee Precaution Booklet Issued: No Restrictions Weight Bearing Restrictions: Yes RLE Weight Bearing: Weight bearing as tolerated    Mobility  Bed Mobility Overal bed mobility: Needs Assistance Bed Mobility: Sit to Supine     Supine to sit: Supervision     General bed mobility comments: Supervision for safety. Pt able to lift RLE out of bed with no physical assist and no use of bedrail this session.    Transfers Overall transfer level: Needs assistance Equipment used: Rolling walker (2 wheeled) Transfers: Sit to/from Stand Sit to Stand: Supervision         General transfer comment: cues for hand placement  Ambulation/Gait Ambulation/Gait assistance: Min guard;Supervision Gait Distance (Feet): 150 Feet Assistive device: Rolling walker (2 wheeled) Gait Pattern/deviations: Step-to pattern;Decreased stride length;Decreased stance time - right;Antalgic Gait velocity: decreased   General Gait Details: cues for erect posture and using BUE support to help offload RLE in stance phase. Cues to stay close to RW as well.   Stairs              Wheelchair Mobility    Modified Rankin (Stroke Patients Only)       Balance Overall balance assessment: Needs assistance Sitting-balance support: No upper extremity supported;Feet supported Sitting balance-Leahy Scale: Fair     Standing balance support: Bilateral upper extremity supported Standing balance-Leahy Scale: Poor Standing balance comment: reliant on at least UE support to balance. Pt able to remove one hand from walker to remove glasses and put on mask.                            Cognition Arousal/Alertness: Awake/alert Behavior During Therapy: WFL for tasks assessed/performed Overall Cognitive Status: Within Functional Limits for tasks assessed                                        Exercises Total Joint Exercises Ankle Circles/Pumps: AROM;Both;Supine;20 reps Quad Sets: AROM;Right;10 reps;Supine Short Arc Quad: AAROM;Right;10 reps;Supine Straight Leg Raises: AAROM;Right;10 reps;Supine Goniometric ROM: 0-82 degrees flexion    General Comments        Pertinent Vitals/Pain Pain Assessment: Faces Faces Pain Scale: Hurts whole lot Pain Location: R knee; increased with gait training/exercises. Pain Descriptors / Indicators: Aching;Discomfort;Grimacing Pain Intervention(s): Limited activity within patient's tolerance;Monitored during session;Repositioned;Premedicated before session    Home Living                      Prior Function  PT Goals (current goals can now be found in the care plan section) Progress towards PT goals: Progressing toward goals    Frequency    7X/week      PT Plan Current plan remains appropriate    Co-evaluation              AM-PAC PT "6 Clicks" Mobility   Outcome Measure  Help needed turning from your back to your side while in a flat bed without using bedrails?: None Help needed moving from lying on your back to sitting on the side of a flat bed without using  bedrails?: None Help needed moving to and from a bed to a chair (including a wheelchair)?: A Little Help needed standing up from a chair using your arms (e.g., wheelchair or bedside chair)?: A Little Help needed to walk in hospital room?: A Little Help needed climbing 3-5 steps with a railing? : A Little 6 Click Score: 20    End of Session Equipment Utilized During Treatment: Gait belt Activity Tolerance: Patient tolerated treatment well;Patient limited by fatigue Patient left: with call bell/phone within reach;in chair;with chair alarm set Nurse Communication: Mobility status PT Visit Diagnosis: Other abnormalities of gait and mobility (R26.89);Pain Pain - Right/Left: Right Pain - part of body: Knee     Time: 1027-2536 PT Time Calculation (min) (ACUTE ONLY): 27 min  Charges:  $Gait Training: 8-22 mins $Therapeutic Exercise: 8-22 mins                     Pamela Walter M,PT Acute Rehab Services [phone] (pager)   Pamela Walter 11/19/2020, 12:39 PM

## 2020-11-20 DIAGNOSIS — K5900 Constipation, unspecified: Secondary | ICD-10-CM | POA: Diagnosis not present

## 2020-11-20 DIAGNOSIS — Z87891 Personal history of nicotine dependence: Secondary | ICD-10-CM | POA: Diagnosis not present

## 2020-11-20 DIAGNOSIS — Z96651 Presence of right artificial knee joint: Secondary | ICD-10-CM | POA: Diagnosis not present

## 2020-11-20 DIAGNOSIS — Z471 Aftercare following joint replacement surgery: Secondary | ICD-10-CM | POA: Diagnosis not present

## 2020-11-20 DIAGNOSIS — I10 Essential (primary) hypertension: Secondary | ICD-10-CM | POA: Diagnosis not present

## 2020-11-20 DIAGNOSIS — E785 Hyperlipidemia, unspecified: Secondary | ICD-10-CM | POA: Diagnosis not present

## 2020-11-24 DIAGNOSIS — Z96651 Presence of right artificial knee joint: Secondary | ICD-10-CM | POA: Diagnosis not present

## 2020-11-24 DIAGNOSIS — I10 Essential (primary) hypertension: Secondary | ICD-10-CM | POA: Diagnosis not present

## 2020-11-24 DIAGNOSIS — K5900 Constipation, unspecified: Secondary | ICD-10-CM | POA: Diagnosis not present

## 2020-11-24 DIAGNOSIS — E785 Hyperlipidemia, unspecified: Secondary | ICD-10-CM | POA: Diagnosis not present

## 2020-11-24 DIAGNOSIS — Z471 Aftercare following joint replacement surgery: Secondary | ICD-10-CM | POA: Diagnosis not present

## 2020-11-24 DIAGNOSIS — Z87891 Personal history of nicotine dependence: Secondary | ICD-10-CM | POA: Diagnosis not present

## 2020-11-24 NOTE — Discharge Summary (Signed)
Patient ID: Pamela Walter MRN: [id number] DOB/AGE: 1951/03/15 70 y.o.  Admit date: 11/16/2020 Discharge date: 11/19/2020 Admission Diagnoses:  Active Problems:   Unilateral primary osteoarthritis, right knee   S/P TKR (total knee replacement) using cement, right   Discharge Diagnoses:  Active Problems:   Unilateral primary osteoarthritis, right knee   S/P TKR (total knee replacement) using cement, right  status post Procedure(s): RIGHT TOTAL KNEE ARTHROPLASTY  Past Medical History:  Diagnosis Date  . Arthritis   . Hyperlipidemia    borderline  . Hypertension   . Irregular heart rate   . Pneumonia    2000 per patient  . Vaginal discharge 06/05/2014    Surgeries: Procedure(s): RIGHT TOTAL KNEE ARTHROPLASTY on 11/16/2020   Consultants:   Discharged Condition: Improved  Hospital Course: Pamela Walter is an 70 y.o. female who was admitted 11/16/2020 for operative treatment of right knee djd. Patient failed conservative treatments (please see the history and physical for the specifics) and had severe unremitting pain that affects sleep, daily activities and work/hobbies. After pre-op clearance, the patient was taken to the operating room on 11/16/2020 and underwent  Procedure(s): RIGHT TOTAL KNEE ARTHROPLASTY.    Patient was given perioperative antibiotics:  Anti-infectives (From admission, onward)   Start     Dose/Rate Route Frequency Ordered Stop   11/16/20 0715  ceFAZolin (ANCEF) IVPB 2g/100 mL premix        2 g 200 mL/hr over 30 Minutes Intravenous On call to O.R. 11/16/20 0708 11/16/20 0911       Patient was given sequential compression devices and early ambulation to prevent DVT.   Patient benefited maximally from hospital stay and there were no complications. At the time of discharge, the patient was urinating/moving their bowels without difficulty, tolerating a regular diet, pain is controlled with oral pain medications and they have been cleared by PT/OT.    Recent vital signs: No data found.   Recent laboratory studies: No results for input(s): WBC, HGB, HCT, PLT, NA, K, CL, CO2, BUN, CREATININE, GLUCOSE, INR, CALCIUM in the last 72 hours.  Invalid input(s): PT, 2   Discharge Medications:   Allergies as of 11/19/2020   No Known Allergies     Medication List    STOP taking these medications   acetaminophen 650 MG CR tablet Commonly known as: TYLENOL   naproxen 500 MG tablet Commonly known as: NAPROSYN     TAKE these medications   amLODipine 5 MG tablet Commonly known as: NORVASC Take 5 mg by mouth daily.   aspirin EC 325 MG tablet Take 1 tablet (325 mg total) by mouth daily. MUST TAKE AT LEAST 4 WEEKS POSTOP FOR DVT (BLOOD CLOT) PROPHYLAXIS.   busPIRone 5 MG tablet Commonly known as: BUSPAR TAKE 1 TABLET BY MOUTH TWICE A DAY What changed:   when to take this  reasons to take this   D3-50 1.25 MG (50000 UT) capsule Generic drug: Cholecalciferol Take 50,000 Units by mouth once a week.   methocarbamol 500 MG tablet Commonly known as: Robaxin Take 1 tablet (500 mg total) by mouth every 6 (six) hours as needed for muscle spasms.   metoprolol succinate 25 MG 24 hr tablet Commonly known as: TOPROL-XL TAKE ONE TABLET BY MOUTH EVERY DAY   oxyCODONE-acetaminophen 5-325 MG tablet Commonly known as: PERCOCET/ROXICET Take 1 tablet by mouth every 4 (four) hours as needed for severe pain.       Diagnostic Studies: DG Knee Right Port  Result Date:  11/16/2020 CLINICAL DATA:  Postop knee arthroplasty EXAM: PORTABLE RIGHT KNEE - 1-2 VIEW COMPARISON:  Portable exam 1047 hours compared to 01/23/2006 FINDINGS: Osseous demineralization. Components of RIGHT knee prosthesis identified. No fracture, dislocation, or bone destruction. Anterior skin clips and expected postsurgical changes of soft tissues. IMPRESSION: RIGHT knee prosthesis without acute abnormalities. Electronically Signed   By: Lavonia Dana M.D.   On: 11/16/2020 11:20        Follow-up Information    Marybelle Killings, MD.   Specialty: Orthopedic Surgery Contact information: [place] [address] [phone]               Discharge Plan:  discharge to home  Disposition:     Signed: Benjiman Core  11/24/2020, 9:51 AM

## 2020-11-26 ENCOUNTER — Ambulatory Visit (INDEPENDENT_AMBULATORY_CARE_PROVIDER_SITE_OTHER): Payer: Medicare HMO | Admitting: Orthopaedic Surgery

## 2020-11-26 ENCOUNTER — Ambulatory Visit (INDEPENDENT_AMBULATORY_CARE_PROVIDER_SITE_OTHER): Payer: Medicare HMO

## 2020-11-26 DIAGNOSIS — Z87891 Personal history of nicotine dependence: Secondary | ICD-10-CM | POA: Diagnosis not present

## 2020-11-26 DIAGNOSIS — E785 Hyperlipidemia, unspecified: Secondary | ICD-10-CM | POA: Diagnosis not present

## 2020-11-26 DIAGNOSIS — Z471 Aftercare following joint replacement surgery: Secondary | ICD-10-CM | POA: Diagnosis not present

## 2020-11-26 DIAGNOSIS — Z96651 Presence of right artificial knee joint: Secondary | ICD-10-CM

## 2020-11-26 DIAGNOSIS — I10 Essential (primary) hypertension: Secondary | ICD-10-CM | POA: Diagnosis not present

## 2020-11-26 DIAGNOSIS — K5900 Constipation, unspecified: Secondary | ICD-10-CM | POA: Diagnosis not present

## 2020-11-26 NOTE — Progress Notes (Signed)
   Post-Op Visit Note   Patient: Pamela Walter           Date of Birth: 1951-06-11           MRN: [id number] Visit Date: 11/26/2020 PCP: Lucianne Lei, MD   Assessment & Plan: Post total knee arthroplasty right knee.  X-rays look good AP lateral.  Slight anterior femoral notching.  Good cement mantle.  She is doing well with home therapy and will transition to outpatient therapy.  Chief Complaint:  Chief Complaint  Patient presents with  . Other     11/16/20 Right TKA   Visit Diagnoses:  1. History of total right knee replacement     Plan: Outpatient therapy.  She has full extension passively can lift her leg with 20 degree extension lag.  Flexing to 90 easily.  Return in 1 week for staple removal and Steri-Strips.  Follow-Up Instructions: Return in about 1 week (around 12/03/2020).   Orders:  Orders Placed This Encounter  Procedures  . XR Knee 1-2 Views Right   No orders of the defined types were placed in this encounter.   Imaging: No results found.  PMFS History: Patient Active Problem List   Diagnosis Date Noted  . S/P TKR (total knee replacement) using cement, right 11/16/2020  . Unilateral primary osteoarthritis, right knee 03/11/2020  . H/O calcium pyrophosphate deposition disease (CPPD) 07/14/2016  . Vaginal discharge 06/05/2014   Past Medical History:  Diagnosis Date  . Arthritis   . Hyperlipidemia    borderline  . Hypertension   . Irregular heart rate   . Pneumonia    2000 per patient  . Vaginal discharge 06/05/2014    Family History  Problem Relation Age of Onset  . Hypertension Mother   . Diabetes Mother   . Cancer Mother   . Hypertension Father   . Heart attack Father   . Hypertension Sister   . Cancer Brother        prostate  . Diabetes Son   . Diabetes Brother   . Dementia Brother     Past Surgical History:  Procedure Laterality Date  . APPENDECTOMY    . CHOLECYSTECTOMY     30 years ago  . COLONOSCOPY  03/01/2011    Procedure: COLONOSCOPY;  Surgeon: Dorothyann Peng, MD;  [facility];  Service: Endoscopy;  Laterality: N/A;  . [place]  . TOTAL KNEE ARTHROPLASTY Right 11/16/2020   Procedure: RIGHT TOTAL KNEE ARTHROPLASTY;  Surgeon: Marybelle Killings, MD;  Location: [place];  Service: Orthopedics;  Laterality: Right;   Social History   Occupational History  . Occupation: retired Therapist, art  Tobacco Use  . Smoking status: Former Smoker    Packs/day: 0.50    Years: 30.00    Pack years: 15.00    Types: Cigarettes    Quit date: 2013    Years since quitting: 9.3  . Smokeless tobacco: Never Used  Vaping Use  . Vaping Use: Never used  Substance and Sexual Activity  . Alcohol use: Yes    Comment: wine once a month  . Drug use: No  . Sexual activity: Not Currently    Birth control/protection: Post-menopausal

## 2020-11-27 DIAGNOSIS — I10 Essential (primary) hypertension: Secondary | ICD-10-CM | POA: Diagnosis not present

## 2020-11-27 DIAGNOSIS — E785 Hyperlipidemia, unspecified: Secondary | ICD-10-CM | POA: Diagnosis not present

## 2020-11-27 DIAGNOSIS — Z471 Aftercare following joint replacement surgery: Secondary | ICD-10-CM | POA: Diagnosis not present

## 2020-11-27 DIAGNOSIS — Z87891 Personal history of nicotine dependence: Secondary | ICD-10-CM | POA: Diagnosis not present

## 2020-11-27 DIAGNOSIS — K5900 Constipation, unspecified: Secondary | ICD-10-CM | POA: Diagnosis not present

## 2020-11-27 DIAGNOSIS — Z96651 Presence of right artificial knee joint: Secondary | ICD-10-CM | POA: Diagnosis not present

## 2020-11-30 DIAGNOSIS — Z87891 Personal history of nicotine dependence: Secondary | ICD-10-CM | POA: Diagnosis not present

## 2020-11-30 DIAGNOSIS — E785 Hyperlipidemia, unspecified: Secondary | ICD-10-CM | POA: Diagnosis not present

## 2020-11-30 DIAGNOSIS — Z96651 Presence of right artificial knee joint: Secondary | ICD-10-CM | POA: Diagnosis not present

## 2020-11-30 DIAGNOSIS — Z471 Aftercare following joint replacement surgery: Secondary | ICD-10-CM | POA: Diagnosis not present

## 2020-11-30 DIAGNOSIS — I10 Essential (primary) hypertension: Secondary | ICD-10-CM | POA: Diagnosis not present

## 2020-11-30 DIAGNOSIS — K5900 Constipation, unspecified: Secondary | ICD-10-CM | POA: Diagnosis not present

## 2020-12-01 DIAGNOSIS — M25561 Pain in right knee: Secondary | ICD-10-CM | POA: Diagnosis not present

## 2020-12-03 ENCOUNTER — Encounter: Payer: Self-pay | Admitting: Orthopaedic Surgery

## 2020-12-03 ENCOUNTER — Ambulatory Visit (INDEPENDENT_AMBULATORY_CARE_PROVIDER_SITE_OTHER): Payer: Medicare HMO | Admitting: Orthopaedic Surgery

## 2020-12-03 ENCOUNTER — Other Ambulatory Visit: Payer: Self-pay

## 2020-12-03 VITALS — Ht 64.0 in | Wt 197.0 lb

## 2020-12-03 DIAGNOSIS — Z96651 Presence of right artificial knee joint: Secondary | ICD-10-CM

## 2020-12-03 NOTE — Progress Notes (Signed)
   Post-Op Visit Note   Patient: Pamela Walter           Date of Birth: March 02, 1951           MRN: [id number] Visit Date: 12/03/2020 PCP: Lucianne Lei, MD   Assessment & Plan: Patient is flexing to 100 degrees she lacks 4 degrees reaching full extension.  Staples harvested Steri-Strips applied.  Recheck 4 to 5 weeks.  Chief Complaint:  Chief Complaint  Patient presents with  . Right Knee - Follow-up    11/16/2020 right TKA   Visit Diagnoses:  1. S/P TKR (total knee replacement) using cement, right     Plan: Continue outpatient therapy she is making good progress.  Follow-Up Instructions: Return in about 4 years (around 12/03/2024).   Orders:  No orders of the defined types were placed in this encounter.  No orders of the defined types were placed in this encounter.   Imaging: No results found.  PMFS History: Patient Active Problem List   Diagnosis Date Noted  . S/P TKR (total knee replacement) using cement, right 11/16/2020  . Unilateral primary osteoarthritis, right knee 03/11/2020  . H/O calcium pyrophosphate deposition disease (CPPD) 07/14/2016  . Vaginal discharge 06/05/2014   Past Medical History:  Diagnosis Date  . Arthritis   . Hyperlipidemia    borderline  . Hypertension   . Irregular heart rate   . Pneumonia    2000 per patient  . Vaginal discharge 06/05/2014    Family History  Problem Relation Age of Onset  . Hypertension Mother   . Diabetes Mother   . Cancer Mother   . Hypertension Father   . Heart attack Father   . Hypertension Sister   . Cancer Brother        prostate  . Diabetes Son   . Diabetes Brother   . Dementia Brother     Past Surgical History:  Procedure Laterality Date  . APPENDECTOMY    . CHOLECYSTECTOMY     30 years ago  . COLONOSCOPY  03/01/2011   Procedure: COLONOSCOPY;  Surgeon: Dorothyann Peng, MD;  [facility];  Service: Endoscopy;  Laterality: N/A;  . [place]  . TOTAL KNEE  ARTHROPLASTY Right 11/16/2020   Procedure: RIGHT TOTAL KNEE ARTHROPLASTY;  Surgeon: Marybelle Killings, MD;  Location: [place];  Service: Orthopedics;  Laterality: Right;   Social History   Occupational History  . Occupation: retired Therapist, art  Tobacco Use  . Smoking status: Former Smoker    Packs/day: 0.50    Years: 30.00    Pack years: 15.00    Types: Cigarettes    Quit date: 2013    Years since quitting: 9.3  . Smokeless tobacco: Never Used  Vaping Use  . Vaping Use: Never used  Substance and Sexual Activity  . Alcohol use: Yes    Comment: wine once a month  . Drug use: No  . Sexual activity: Not Currently    Birth control/protection: Post-menopausal

## 2020-12-08 DIAGNOSIS — M25561 Pain in right knee: Secondary | ICD-10-CM | POA: Diagnosis not present

## 2020-12-10 ENCOUNTER — Telehealth: Payer: Self-pay | Admitting: Radiology

## 2020-12-10 ENCOUNTER — Other Ambulatory Visit: Payer: Self-pay | Admitting: Surgery

## 2020-12-10 MED ORDER — HYDROCODONE-ACETAMINOPHEN 7.5-325 MG PO TABS: 1.0000 | ORAL_TABLET | Freq: Four times a day (QID) | ORAL | 0 refills | Status: DC | PRN

## 2020-12-10 NOTE — Telephone Encounter (Signed)
I called patient and advised. 

## 2020-12-10 NOTE — Telephone Encounter (Signed)
Will send in norco

## 2020-12-10 NOTE — Telephone Encounter (Signed)
Pamela Walter, DOB: 2050/12/12, Ph #: [phone] came by and states she needs a refill of her pain med please.   James--please advise. Patient went by [facility] office to request refill of pain medication. She is status post right total knee arthroplasty 11/16/2020.  She was given Oxycodone 5/325 1 po q 4 hours prn #50 on 11/16/2020.  Thanks.

## 2020-12-11 ENCOUNTER — Other Ambulatory Visit: Payer: Self-pay | Admitting: Surgery

## 2020-12-11 DIAGNOSIS — M25561 Pain in right knee: Secondary | ICD-10-CM | POA: Diagnosis not present

## 2020-12-11 NOTE — Telephone Encounter (Signed)
Please advise 

## 2020-12-16 ENCOUNTER — Other Ambulatory Visit: Payer: Self-pay | Admitting: Surgery

## 2020-12-16 DIAGNOSIS — M25561 Pain in right knee: Secondary | ICD-10-CM | POA: Diagnosis not present

## 2020-12-16 NOTE — Telephone Encounter (Signed)
She is 4 weeks postop.  Ok to discontinue.

## 2020-12-16 NOTE — Telephone Encounter (Signed)
Please advise 

## 2020-12-16 NOTE — Telephone Encounter (Signed)
OK to send in # 30 tabs  one po bid prn. It has been one month since surgery and she should be fine with 30 . [place] thanks

## 2020-12-17 ENCOUNTER — Other Ambulatory Visit: Payer: Self-pay | Admitting: Radiology

## 2020-12-17 MED ORDER — METHOCARBAMOL 500 MG PO TABS: 500.0000 mg | ORAL_TABLET | Freq: Two times a day (BID) | ORAL | 0 refills | Status: DC | PRN

## 2020-12-23 DIAGNOSIS — M25561 Pain in right knee: Secondary | ICD-10-CM | POA: Diagnosis not present

## 2020-12-31 ENCOUNTER — Ambulatory Visit (INDEPENDENT_AMBULATORY_CARE_PROVIDER_SITE_OTHER): Payer: Medicare HMO | Admitting: Orthopaedic Surgery

## 2020-12-31 ENCOUNTER — Other Ambulatory Visit: Payer: Self-pay

## 2020-12-31 ENCOUNTER — Encounter: Payer: Self-pay | Admitting: Orthopaedic Surgery

## 2020-12-31 VITALS — Ht 64.0 in | Wt 197.0 lb

## 2020-12-31 DIAGNOSIS — Z96651 Presence of right artificial knee joint: Secondary | ICD-10-CM

## 2020-12-31 NOTE — Progress Notes (Signed)
   Post-Op Visit Note   Patient: Pamela Walter           Date of Birth: January 08, 1951           MRN: [id number] Visit Date: 12/31/2020 PCP: Lucianne Lei, MD   Assessment & Plan: Post right total knee arthroplasty.  She has full extension she is walking with a cane.  Flexing to 110.  Quad still weak.  Continue strengthening of the quad and she should be able to get another 10 degrees of flexion which we went over carefully.  Recheck 5 weeks.  She is off pain medication.  Chief Complaint:  Chief Complaint  Patient presents with   Right Knee - Follow-up    11/16/2020 Right TKA   Visit Diagnoses:  1. S/P TKR (total knee replacement) using cement, right     Plan: Return 5 weeks.  Follow-Up Instructions: Return in about 5 weeks (around 02/04/2021).   Orders:  No orders of the defined types were placed in this encounter.  No orders of the defined types were placed in this encounter.   Imaging: No results found.  PMFS History: Patient Active Problem List   Diagnosis Date Noted   S/P TKR (total knee replacement) using cement, right 11/16/2020   Unilateral primary osteoarthritis, right knee 03/11/2020   H/O calcium pyrophosphate deposition disease (CPPD) 07/14/2016   Vaginal discharge 06/05/2014   Past Medical History:  Diagnosis Date   Arthritis    Hyperlipidemia    borderline   Hypertension    Irregular heart rate    Pneumonia    2000 per patient   Vaginal discharge 06/05/2014    Family History  Problem Relation Age of Onset   Hypertension Mother    Diabetes Mother    Cancer Mother    Hypertension Father    Heart attack Father    Hypertension Sister    Cancer Brother        prostate   Diabetes Son    Diabetes Brother    Dementia Brother     Past Surgical History:  Procedure Laterality Date   APPENDECTOMY     CHOLECYSTECTOMY     30 years ago   COLONOSCOPY  03/01/2011   Procedure: COLONOSCOPY;  Surgeon: Dorothyann Peng, MD;  [facility];   Service: Endoscopy;  Laterality: N/A;   TEMPOROMANDIBULAR JOINT SURGERY  1993   TOTAL KNEE ARTHROPLASTY Right 11/16/2020   Procedure: RIGHT TOTAL KNEE ARTHROPLASTY;  Surgeon: Marybelle Killings, MD;  Location: [place];  Service: Orthopedics;  Laterality: Right;   Social History   Occupational History   Occupation: retired Therapist, art  Tobacco Use   Smoking status: Former    Packs/day: 0.50    Years: 30.00    Pack years: 15.00    Types: Cigarettes    Quit date: 2013    Years since quitting: 9.4   Smokeless tobacco: Never  Vaping Use   Vaping Use: Never used  Substance and Sexual Activity   Alcohol use: Yes    Comment: wine once a month   Drug use: No   Sexual activity: Not Currently    Birth control/protection: Post-menopausal

## 2021-01-06 DIAGNOSIS — M25561 Pain in right knee: Secondary | ICD-10-CM | POA: Diagnosis not present

## 2021-01-20 DIAGNOSIS — M25561 Pain in right knee: Secondary | ICD-10-CM | POA: Diagnosis not present

## 2021-02-03 DIAGNOSIS — M25561 Pain in right knee: Secondary | ICD-10-CM | POA: Diagnosis not present

## 2021-02-04 ENCOUNTER — Encounter: Payer: Self-pay | Admitting: *Deleted

## 2021-02-11 ENCOUNTER — Ambulatory Visit (INDEPENDENT_AMBULATORY_CARE_PROVIDER_SITE_OTHER): Payer: Medicare HMO | Admitting: Orthopaedic Surgery

## 2021-02-11 ENCOUNTER — Encounter: Payer: Self-pay | Admitting: Orthopaedic Surgery

## 2021-02-11 ENCOUNTER — Other Ambulatory Visit: Payer: Self-pay

## 2021-02-11 VITALS — Ht 64.0 in | Wt 197.0 lb

## 2021-02-11 DIAGNOSIS — Z96651 Presence of right artificial knee joint: Secondary | ICD-10-CM

## 2021-02-11 NOTE — Progress Notes (Signed)
   Post-Op Visit Note   Patient: Pamela Walter           Date of Birth: 07/16/1951           MRN: [id number] Visit Date: 02/11/2021 PCP: Lucianne Lei, MD   Assessment & Plan: Post right total knee arthroplasty.  Flexion and extension is normal.  She is able to ambulate without limping but still has quad deficit.  We went over several exercises so she does not have to use a hop step when she goes upstairs.  Recheck 8 weeks.  Chief Complaint:  Chief Complaint  Patient presents with   Right Knee - Follow-up    11/16/2020 Right TKA   Visit Diagnoses:  1. S/P TKR (total knee replacement) using cement, right     Plan: Continue quad strengthening exercises on her own.  I gave her several advanced exercises.  Recheck 8 weeks.  Follow-Up Instructions: Return in about 8 weeks (around 04/08/2021).   Orders:  No orders of the defined types were placed in this encounter.  No orders of the defined types were placed in this encounter.   Imaging: No results found.  PMFS History: Patient Active Problem List   Diagnosis Date Noted   S/P TKR (total knee replacement) using cement, right 11/16/2020   Unilateral primary osteoarthritis, right knee 03/11/2020   H/O calcium pyrophosphate deposition disease (CPPD) 07/14/2016   Vaginal discharge 06/05/2014   Past Medical History:  Diagnosis Date   Arthritis    Hyperlipidemia    borderline   Hypertension    Irregular heart rate    Pneumonia    2000 per patient   Vaginal discharge 06/05/2014    Family History  Problem Relation Age of Onset   Hypertension Mother    Diabetes Mother    Cancer Mother    Hypertension Father    Heart attack Father    Hypertension Sister    Cancer Brother        prostate   Diabetes Son    Diabetes Brother    Dementia Brother     Past Surgical History:  Procedure Laterality Date   APPENDECTOMY     CHOLECYSTECTOMY     30 years ago   COLONOSCOPY  03/01/2011   Procedure: COLONOSCOPY;  Surgeon: Dorothyann Peng, MD;  [facility];  Service: Endoscopy;  Laterality: N/A;   TEMPOROMANDIBULAR JOINT SURGERY  1993   TOTAL KNEE ARTHROPLASTY Right 11/16/2020   Procedure: RIGHT TOTAL KNEE ARTHROPLASTY;  Surgeon: Marybelle Killings, MD;  Location: [place];  Service: Orthopedics;  Laterality: Right;   Social History   Occupational History   Occupation: retired Therapist, art  Tobacco Use   Smoking status: Former    Packs/day: 0.50    Years: 30.00    Pack years: 15.00    Types: Cigarettes    Quit date: 2013    Years since quitting: 9.5   Smokeless tobacco: Never  Vaping Use   Vaping Use: Never used  Substance and Sexual Activity   Alcohol use: Yes    Comment: wine once a month   Drug use: No   Sexual activity: Not Currently    Birth control/protection: Post-menopausal

## 2021-02-25 DIAGNOSIS — I10 Essential (primary) hypertension: Secondary | ICD-10-CM | POA: Diagnosis not present

## 2021-02-25 DIAGNOSIS — M130 Polyarthritis, unspecified: Secondary | ICD-10-CM | POA: Diagnosis not present

## 2021-02-25 DIAGNOSIS — E559 Vitamin D deficiency, unspecified: Secondary | ICD-10-CM | POA: Diagnosis not present

## 2021-03-28 ENCOUNTER — Other Ambulatory Visit: Payer: Self-pay | Admitting: Surgery

## 2021-03-28 ENCOUNTER — Other Ambulatory Visit: Payer: Self-pay | Admitting: Orthopaedic Surgery

## 2021-03-29 NOTE — Telephone Encounter (Signed)
Please advise 

## 2021-04-01 ENCOUNTER — Telehealth: Payer: Self-pay

## 2021-04-01 NOTE — Telephone Encounter (Signed)
Talked with patient and advised her of message in chart, per Dr. Lorin Mercy. Patient voiced that she understands.

## 2021-04-08 ENCOUNTER — Ambulatory Visit (INDEPENDENT_AMBULATORY_CARE_PROVIDER_SITE_OTHER): Payer: Medicare HMO | Admitting: Orthopaedic Surgery

## 2021-04-08 ENCOUNTER — Encounter: Payer: Self-pay | Admitting: Orthopaedic Surgery

## 2021-04-08 ENCOUNTER — Other Ambulatory Visit: Payer: Self-pay

## 2021-04-08 VITALS — Ht 64.0 in | Wt 197.0 lb

## 2021-04-08 DIAGNOSIS — M7542 Impingement syndrome of left shoulder: Secondary | ICD-10-CM | POA: Insufficient documentation

## 2021-04-08 DIAGNOSIS — M1711 Unilateral primary osteoarthritis, right knee: Secondary | ICD-10-CM | POA: Diagnosis not present

## 2021-04-08 NOTE — Progress Notes (Signed)
Office Visit Note   Patient: Pamela Walter           Date of Birth: 09-Jul-1951           MRN: [id number] Visit Date: 04/08/2021              Requested by: Lucianne Lei, MD [address] [place],  [place] [address] PCP: Lucianne Lei, MD   Assessment & Plan: Visit Diagnoses:  1. Unilateral primary osteoarthritis, right knee   2. Impingement syndrome of left shoulder     Plan: She will call make an appointment few months if she has persistent problems with her shoulder.  She is happy with the results of the knee arthroplasty.  Follow-Up Instructions: Return if symptoms worsen or fail to improve.   Orders:  No orders of the defined types were placed in this encounter.  No orders of the defined types were placed in this encounter.     Procedures: No procedures performed   Clinical Data: No additional findings.   Subjective: Chief Complaint  Patient presents with   Right Knee - Follow-up    11/16/2020 Right TKA    HPI 4 months post total knee arthroplasty.  Knee is doing well.  When she takes her Norvasc in the morning and metoprolol she sometimes feels a little bit dizzy for a while.  She has had problems with her left shoulder particularly with outstretch reaching and overhead activity.  Patient is happy with the results of her knee and is amatory without limping.  She asked if she needs a refill of pain medication due to her shoulder discomfort.  Review of Systems updated unchanged other than left shoulder pain.   Objective: Vital Signs: Ht 5\' 4"  (1.626 m)   Wt 197 lb (89.4 kg)   BMI 33.81 kg/m   Physical Exam Constitutional:      Appearance: She is well-developed.  HENT:     Head: Normocephalic.     Right Ear: External ear normal.     Left Ear: External ear normal. There is no impacted cerumen.  Eyes:     Pupils: Pupils are equal, round, and reactive to light.  Neck:     Thyroid: No thyromegaly.     Trachea: No tracheal deviation.   Cardiovascular:     Rate and Rhythm: Normal rate.  Pulmonary:     Effort: Pulmonary effort is normal.  Abdominal:     Palpations: Abdomen is soft.  Musculoskeletal:     Cervical back: No rigidity.  Skin:    General: Skin is warm and dry.  Neurological:     Mental Status: She is alert and oriented to person, place, and time.  Psychiatric:        Behavior: Behavior normal.    Ortho Exam positive impingement left shoulder negative drop arm test full active range of motion with discomfort with outstretch reaching overhead activity.  Knee shows full extension flexion past 125.  Collateral ligaments are stable no swelling scar is nicely healed.  Well  Specialty Comments:  No specialty comments available.  Imaging: No results found.   PMFS History: Patient Active Problem List   Diagnosis Date Noted   Impingement syndrome of left shoulder 04/08/2021   S/P TKR (total knee replacement) using cement, right 11/16/2020   Unilateral primary osteoarthritis, right knee 03/11/2020   H/O calcium pyrophosphate deposition disease (CPPD) 07/14/2016   Vaginal discharge 06/05/2014   Past Medical History:  Diagnosis Date   Arthritis  Hyperlipidemia    borderline   Hypertension    Irregular heart rate    Pneumonia    2000 per patient   Vaginal discharge 06/05/2014    Family History  Problem Relation Age of Onset   Hypertension Mother    Diabetes Mother    Cancer Mother    Hypertension Father    Heart attack Father    Hypertension Sister    Cancer Brother        prostate   Diabetes Son    Diabetes Brother    Dementia Brother     Past Surgical History:  Procedure Laterality Date   APPENDECTOMY     CHOLECYSTECTOMY     30 years ago   COLONOSCOPY  03/01/2011   Procedure: COLONOSCOPY;  Surgeon: Dorothyann Peng, MD;  [facility];  Service: Endoscopy;  Laterality: N/A;   TEMPOROMANDIBULAR JOINT SURGERY  1993   TOTAL KNEE ARTHROPLASTY Right 11/16/2020   Procedure: RIGHT  TOTAL KNEE ARTHROPLASTY;  Surgeon: Marybelle Killings, MD;  Location: [facility];  Service: Orthopedics;  Laterality: Right;   Social History   Occupational History   Occupation: retired Therapist, art  Tobacco Use   Smoking status: Former    Packs/day: 0.50    Years: 30.00    Pack years: 15.00    Types: Cigarettes    Quit date: 2013    Years since quitting: 9.7   Smokeless tobacco: Never  Vaping Use   Vaping Use: Never used  Substance and Sexual Activity   Alcohol use: Yes    Comment: wine once a month   Drug use: No   Sexual activity: Not Currently    Birth control/protection: Post-menopausal

## 2021-06-28 DIAGNOSIS — I10 Essential (primary) hypertension: Secondary | ICD-10-CM | POA: Diagnosis not present

## 2021-06-28 DIAGNOSIS — R69 Illness, unspecified: Secondary | ICD-10-CM | POA: Diagnosis not present

## 2021-06-28 DIAGNOSIS — E559 Vitamin D deficiency, unspecified: Secondary | ICD-10-CM | POA: Diagnosis not present

## 2021-06-28 DIAGNOSIS — E782 Mixed hyperlipidemia: Secondary | ICD-10-CM | POA: Diagnosis not present

## 2021-07-05 ENCOUNTER — Telehealth: Payer: Self-pay | Admitting: Radiology

## 2021-07-05 NOTE — Telephone Encounter (Signed)
faxed

## 2021-07-05 NOTE — Telephone Encounter (Signed)
Received call from [facility] with Dr. Harrington Challenger dental office. Patient had TKA this year and the office needs documentation in regards to patient having dental procedure. Advised Dr. Lorin Mercy does not require pre med. Note to be faxed to [phone].

## 2021-10-21 DIAGNOSIS — E559 Vitamin D deficiency, unspecified: Secondary | ICD-10-CM | POA: Diagnosis not present

## 2021-10-21 DIAGNOSIS — E785 Hyperlipidemia, unspecified: Secondary | ICD-10-CM | POA: Diagnosis not present

## 2021-10-21 DIAGNOSIS — E1169 Type 2 diabetes mellitus with other specified complication: Secondary | ICD-10-CM | POA: Diagnosis not present

## 2021-10-21 DIAGNOSIS — E7800 Pure hypercholesterolemia, unspecified: Secondary | ICD-10-CM | POA: Diagnosis not present

## 2021-10-21 DIAGNOSIS — F5102 Adjustment insomnia: Secondary | ICD-10-CM | POA: Diagnosis not present

## 2021-10-21 DIAGNOSIS — Z6828 Body mass index (BMI) 28.0-28.9, adult: Secondary | ICD-10-CM | POA: Diagnosis not present

## 2021-10-21 DIAGNOSIS — I10 Essential (primary) hypertension: Secondary | ICD-10-CM | POA: Diagnosis not present

## 2021-12-21 ENCOUNTER — Encounter: Payer: Self-pay | Admitting: *Deleted

## 2022-03-14 DIAGNOSIS — E785 Hyperlipidemia, unspecified: Secondary | ICD-10-CM | POA: Diagnosis not present

## 2022-03-14 DIAGNOSIS — E559 Vitamin D deficiency, unspecified: Secondary | ICD-10-CM | POA: Diagnosis not present

## 2022-03-14 DIAGNOSIS — I10 Essential (primary) hypertension: Secondary | ICD-10-CM | POA: Diagnosis not present

## 2022-03-14 DIAGNOSIS — Z6828 Body mass index (BMI) 28.0-28.9, adult: Secondary | ICD-10-CM | POA: Diagnosis not present

## 2022-03-14 DIAGNOSIS — R7303 Prediabetes: Secondary | ICD-10-CM | POA: Diagnosis not present

## 2022-04-10 ENCOUNTER — Encounter: Payer: Self-pay | Admitting: Emergency Medicine

## 2022-04-10 ENCOUNTER — Other Ambulatory Visit: Payer: Self-pay

## 2022-04-10 ENCOUNTER — Ambulatory Visit
Admission: EM | Admit: 2022-04-10 | Discharge: 2022-04-10 | Disposition: A | Discharge status: Home / Self Care | Payer: Medicare HMO | Attending: Physician Assistant | Admitting: Physician Assistant

## 2022-04-10 DIAGNOSIS — Z1152 Encounter for screening for COVID-19: Secondary | ICD-10-CM | POA: Diagnosis not present

## 2022-04-10 DIAGNOSIS — J069 Acute upper respiratory infection, unspecified: Secondary | ICD-10-CM

## 2022-04-10 LAB — SARS CORONAVIRUS 2 (TAT 6-24 HRS): SARS Coronavirus 2: POSITIVE — AB

## 2022-04-10 NOTE — ED Provider Notes (Signed)
[facility]    CSN: [id number] Arrival date & time: 04/10/22  1343      History   Chief Complaint Chief Complaint  Patient presents with   Cough    HPI Pamela Walter is a 71 y.o. female.   Patient here today for covid test after daughter tested positive 2 days ago. Patient reports two days ago she started to have runny nose and cough that seemed to worsen today. She has not had known fever but does have low grade fever in office today. She reports her daughter does live with her. She does not report treatment for symptoms.  The history is provided by the patient.    Past Medical History:  Diagnosis Date   Arthritis    Hyperlipidemia    borderline   Hypertension    Irregular heart rate    Pneumonia    2000 per patient   Vaginal discharge 06/05/2014    Patient Active Problem List   Diagnosis Date Noted   Impingement syndrome of left shoulder 04/08/2021   S/P TKR (total knee replacement) using cement, right 11/16/2020   Unilateral primary osteoarthritis, right knee 03/11/2020   H/O calcium pyrophosphate deposition disease (CPPD) 07/14/2016   Vaginal discharge 06/05/2014    Past Surgical History:  Procedure Laterality Date   APPENDECTOMY     CHOLECYSTECTOMY     30 years ago   COLONOSCOPY  03/01/2011   Procedure: COLONOSCOPY;  Surgeon: Dorothyann Peng, MD;  [facility];  Service: Endoscopy;  Laterality: N/A;   TEMPOROMANDIBULAR JOINT SURGERY  1993   TOTAL KNEE ARTHROPLASTY Right 11/16/2020   Procedure: RIGHT TOTAL KNEE ARTHROPLASTY;  Surgeon: Marybelle Killings, MD;  Location: [facility];  Service: Orthopedics;  Laterality: Right;    OB History     Gravida  4   Para  2   Term      Preterm      AB  2   Living  2      SAB  2   IAB      Ectopic      Multiple      Live Births               Home Medications    Prior to Admission medications   Medication Sig Start Date End Date Taking? Authorizing Provider  amLODipine  (NORVASC) 5 MG tablet Take 5 mg by mouth daily.  04/21/16   [provider]  aspirin EC 325 MG tablet Take 1 tablet (325 mg total) by mouth daily. MUST TAKE AT LEAST 4 WEEKS POSTOP FOR DVT (BLOOD CLOT) PROPHYLAXIS. Patient not taking: Reported on 04/08/2021 11/16/20   Lanae Crumbly, PA-C  busPIRone (BUSPAR) 5 MG tablet TAKE 1 TABLET BY MOUTH TWICE A DAY Patient not taking: Reported on 04/10/2022 06/04/18   Roma Schanz, CNM  D3-50 1.25 MG (50000 UT) capsule Take 50,000 Units by mouth once a week. 07/24/20   [provider]  HYDROcodone-acetaminophen (NORCO) 7.5-325 MG tablet Take 1 tablet by mouth every 6 (six) hours as needed for moderate pain. Patient not taking: Reported on 04/08/2021 12/10/20   Lanae Crumbly, PA-C  methocarbamol (ROBAXIN) 500 MG tablet Take 1 tablet (500 mg total) by mouth 2 (two) times daily as needed for muscle spasms. Patient not taking: Reported on 04/08/2021 12/17/20   Marybelle Killings, MD  metoprolol succinate (TOPROL-XL) 25 MG 24 hr tablet TAKE ONE TABLET BY MOUTH EVERY DAY Patient taking  differently: Take 25 mg by mouth daily. 11/22/12   Jonnie Kind, MD  oxyCODONE-acetaminophen (PERCOCET/ROXICET) 5-325 MG tablet Take 1 tablet by mouth every 4 (four) hours as needed for severe pain. Patient not taking: No sig reported 11/16/20   Lanae Crumbly, PA-C  rosuvastatin (CRESTOR) 10 MG tablet  12/23/20   [provider]    Family History Family History  Problem Relation Age of Onset   Hypertension Mother    Diabetes Mother    Cancer Mother    Hypertension Father    Heart attack Father    Hypertension Sister    Cancer Brother        prostate   Diabetes Son    Diabetes Brother    Dementia Brother     Social History Social History   Tobacco Use   Smoking status: Former    Packs/day: 0.50    Years: 30.00    Total pack years: 15.00    Types: Cigarettes    Quit date: 2013    Years since quitting: 10.7   Smokeless tobacco: Never  Vaping  Use   Vaping Use: Never used  Substance Use Topics   Alcohol use: Yes    Comment: wine once a month   Drug use: No     Allergies   Patient has no known allergies.   Review of Systems Review of Systems  Constitutional:  Positive for fever. Negative for chills.  HENT:  Positive for congestion and rhinorrhea. Negative for ear pain and sore throat.   Eyes:  Negative for discharge and redness.  Respiratory:  Positive for cough. Negative for shortness of breath and wheezing.   Gastrointestinal:  Negative for abdominal pain, diarrhea, nausea and vomiting.     Physical Exam Triage Vital Signs ED Triage Vitals  Enc Vitals Group     BP      Pulse      Resp      Temp      Temp src      SpO2      Weight      Height      Head Circumference      Peak Flow      Pain Score      Pain Loc      Pain Edu?      Excl. in [place]?    No data found.  Updated Vital Signs BP 108/65 (BP Location: Right Arm)   Pulse 84   Temp 99.6 F (37.6 C) (Oral)   Resp 20   SpO2 95%      Physical Exam Vitals and nursing note reviewed.  Constitutional:      General: She is not in acute distress.    Appearance: Normal appearance. She is not ill-appearing.  HENT:     Head: Normocephalic and atraumatic.     Nose: Congestion present.  Eyes:     Conjunctiva/sclera: Conjunctivae normal.  Cardiovascular:     Rate and Rhythm: Normal rate and regular rhythm.     Heart sounds: Normal heart sounds. No murmur heard. Pulmonary:     Effort: Pulmonary effort is normal. No respiratory distress.     Breath sounds: Normal breath sounds. No wheezing, rhonchi or rales.  Skin:    General: Skin is warm and dry.  Neurological:     Mental Status: She is alert.  Psychiatric:        Mood and Affect: Mood normal.        Thought Content:  Thought content normal.      UC Treatments / Results  Labs (all labs ordered are listed, but only abnormal results are displayed) Labs Reviewed  SARS CORONAVIRUS 2 (TAT  6-24 HRS)    EKG   Radiology No results found.  Procedures Procedures (including critical care time)  Medications Ordered in UC Medications - No data to display  Initial Impression / Assessment and Plan / UC Course  I have reviewed the triage vital signs and the nursing notes.  Pertinent labs & imaging results that were available during my care of the patient were reviewed by me and considered in my medical decision making (see chart for details).   Patient reports that she has recently had labwork through her PCP- recommended she contact them if covid positive and she would like paxlovid for treatment.  Patient expresses understanding.    Final Clinical Impressions(s) / UC Diagnoses   Final diagnoses:  Encounter for screening for COVID-19  Acute upper respiratory infection   Discharge Instructions   None    ED Prescriptions   None    PDMP not reviewed this encounter.   Francene Finders, PA-C 04/10/22 [phone]

## 2022-04-10 NOTE — ED Triage Notes (Signed)
Cough, runny nose started Friday 04/08/2022.  Patient reports her daughter was diagnosed with covid on Friday and lives with patient,  patient wanting covid test

## 2022-04-25 DIAGNOSIS — M130 Polyarthritis, unspecified: Secondary | ICD-10-CM | POA: Diagnosis not present

## 2022-04-25 DIAGNOSIS — E559 Vitamin D deficiency, unspecified: Secondary | ICD-10-CM | POA: Diagnosis not present

## 2022-04-25 DIAGNOSIS — I10 Essential (primary) hypertension: Secondary | ICD-10-CM | POA: Diagnosis not present

## 2022-04-25 DIAGNOSIS — E782 Mixed hyperlipidemia: Secondary | ICD-10-CM | POA: Diagnosis not present

## 2022-04-25 DIAGNOSIS — E1169 Type 2 diabetes mellitus with other specified complication: Secondary | ICD-10-CM | POA: Diagnosis not present

## 2022-04-25 DIAGNOSIS — F5102 Adjustment insomnia: Secondary | ICD-10-CM | POA: Diagnosis not present

## 2022-05-04 ENCOUNTER — Encounter: Payer: Self-pay | Admitting: *Deleted

## 2022-05-04 NOTE — Patient Instructions (Signed)
  Procedure: colonoscopy  Estimated body mass index is 33.81 kg/m as calculated from the following:   Height as of 04/08/21: '5\' 4"'$  (1.626 m).   Weight as of 04/08/21: 197 lb (89.4 kg).   Have you had a colonoscopy before?  Yes 03/01/11, Dr. Oneida Alar  Do you have family history of colon cancer  no  Do you have a family history of polyps? no  Previous colonoscopy with polyps removed? no  Do you have a history colorectal cancer?   no  Are you diabetic?  no  Do you have a prosthetic or mechanical heart valve? no  Do you have a pacemaker/defibrillator?   no  Have you had endocarditis/atrial fibrillation?  no  Do you use supplemental oxygen/CPAP?  no  Have you had joint replacement within the last 12 months?  no  Do you tend to be constipated or have to use laxatives?  no   Do you have history of alcohol use? If yes, how much and how often.  no  Do you have history or are you using drugs? If yes, what do are you  using?  no  Have you ever had a stroke/heart attack?  no  Have you ever had a heart or other vascular stent placed,?no  Do you take weight loss medication? no  female patients,: have you had a hysterectomy? no                              are you post menopausal?  no                              do you still have your menstrual cycle? no    Date of last menstrual period.   Do you take any blood-thinning medications such as: (Plavix, aspirin, Coumadin, Aggrenox, Brilinta, Xarelto, Eliquis, Pradaxa, Savaysa or Effient) no  If yes we need the name, milligram, dosage and who is prescribing doctor:               Current Outpatient Medications  Medication Sig Dispense Refill   amLODipine (NORVASC) 5 MG tablet Take 5 mg by mouth daily.      clonazePAM (KLONOPIN) 0.5 MG tablet Take 0.5 mg by mouth daily.     D3-50 1.25 MG (50000 UT) capsule Take 50,000 Units by mouth once a week.     metoprolol tartrate (LOPRESSOR) 25 MG tablet Take 25 mg by mouth daily.      rosuvastatin (CRESTOR) 10 MG tablet      No current facility-administered medications for this visit.    No Known Allergies

## 2022-05-13 NOTE — Progress Notes (Signed)
Ok to schedule. ASA 2.  

## 2022-05-16 NOTE — Progress Notes (Signed)
ATC no answer and not able to leave VM

## 2022-06-08 ENCOUNTER — Encounter: Payer: Self-pay | Admitting: *Deleted

## 2022-06-08 MED ORDER — PEG 3350-KCL-NA BICARB-NACL 420 G PO SOLR: 4000.0000 mL | Freq: Once | ORAL | 0 refills | Status: AC

## 2022-06-08 NOTE — Progress Notes (Signed)
PA via [employer]: Approved Authorization #[id number]  Tracking #[id number] DOS:07/15/22-07/16/22

## 2022-06-08 NOTE — Progress Notes (Signed)
Pt has been scheduled for 07/15/22 at 9:30 am with Dr.Carver. Instructions mailed and prep sent to the pharmacy

## 2022-07-15 ENCOUNTER — Ambulatory Visit (HOSPITAL_COMMUNITY): Payer: Medicare HMO | Admitting: Anesthesiology

## 2022-07-15 ENCOUNTER — Ambulatory Visit (HOSPITAL_COMMUNITY)
Admission: RE | Admit: 2022-07-15 | Discharge: 2022-07-15 | Disposition: A | Discharge status: Home / Self Care | Payer: Medicare HMO | Source: Ambulatory Visit | Attending: Internal Medicine | Admitting: Internal Medicine

## 2022-07-15 ENCOUNTER — Encounter (HOSPITAL_COMMUNITY)
Admission: RE | Disposition: A | Discharge status: Home / Self Care | Payer: Self-pay | Source: Ambulatory Visit | Attending: Internal Medicine

## 2022-07-15 ENCOUNTER — Other Ambulatory Visit: Payer: Self-pay

## 2022-07-15 ENCOUNTER — Encounter (HOSPITAL_COMMUNITY): Payer: Self-pay

## 2022-07-15 DIAGNOSIS — I10 Essential (primary) hypertension: Secondary | ICD-10-CM | POA: Insufficient documentation

## 2022-07-15 DIAGNOSIS — Z1211 Encounter for screening for malignant neoplasm of colon: Secondary | ICD-10-CM | POA: Diagnosis not present

## 2022-07-15 DIAGNOSIS — Z139 Encounter for screening, unspecified: Secondary | ICD-10-CM | POA: Diagnosis not present

## 2022-07-15 DIAGNOSIS — K648 Other hemorrhoids: Secondary | ICD-10-CM | POA: Insufficient documentation

## 2022-07-15 DIAGNOSIS — M1990 Unspecified osteoarthritis, unspecified site: Secondary | ICD-10-CM | POA: Diagnosis not present

## 2022-07-15 DIAGNOSIS — D125 Benign neoplasm of sigmoid colon: Secondary | ICD-10-CM

## 2022-07-15 DIAGNOSIS — Z87891 Personal history of nicotine dependence: Secondary | ICD-10-CM | POA: Insufficient documentation

## 2022-07-15 DIAGNOSIS — K635 Polyp of colon: Secondary | ICD-10-CM | POA: Diagnosis not present

## 2022-07-15 DIAGNOSIS — Z79899 Other long term (current) drug therapy: Secondary | ICD-10-CM | POA: Diagnosis not present

## 2022-07-15 DIAGNOSIS — K5730 Diverticulosis of large intestine without perforation or abscess without bleeding: Secondary | ICD-10-CM | POA: Diagnosis not present

## 2022-07-15 DIAGNOSIS — E785 Hyperlipidemia, unspecified: Secondary | ICD-10-CM | POA: Diagnosis not present

## 2022-07-15 HISTORY — PX: POLYPECTOMY: SHX5525

## 2022-07-15 HISTORY — PX: COLONOSCOPY WITH PROPOFOL: SHX5780

## 2022-07-15 SURGERY — COLONOSCOPY WITH PROPOFOL
Anesthesia: Monitor Anesthesia Care

## 2022-07-15 MED ORDER — LACTATED RINGERS IV SOLN: INTRAVENOUS | Status: DC

## 2022-07-15 MED ORDER — PROPOFOL 500 MG/50ML IV EMUL
INTRAVENOUS | Status: DC | PRN
  Administered 2022-07-15: 200 ug/kg/min via INTRAVENOUS

## 2022-07-15 MED ORDER — LIDOCAINE HCL (CARDIAC) PF 100 MG/5ML IV SOSY
PREFILLED_SYRINGE | INTRAVENOUS | Status: DC | PRN
  Administered 2022-07-15: 50 mg via INTRATRACHEAL

## 2022-07-15 MED ORDER — PROPOFOL 10 MG/ML IV BOLUS
INTRAVENOUS | Status: DC | PRN
  Administered 2022-07-15: 90 mg via INTRAVENOUS

## 2022-07-15 NOTE — Op Note (Signed)
[facility] Patient Name: Pamela Walter Procedure Date: 07/15/2022 9:09 AM MRN: [id number] Date of Birth: [birth year] Attending MD: Elon Alas. Abbey Chatters , [place], [id number] CSN: [id number] Age: 71 Admit Type: Outpatient Procedure:                Colonoscopy Indications:              Screening for colorectal malignant neoplasm Providers:                Elon Alas. Abbey Chatters, DO, Tammy Vaught, RN, Everardo Pacific Referring MD:              Medicines:                See the Anesthesia note for documentation of the                            administered medications Complications:            No immediate complications. Estimated Blood Loss:     Estimated blood loss was minimal. Procedure:                Pre-Anesthesia Assessment:                           - The anesthesia plan was to use monitored                            anesthesia care (MAC).                           After obtaining informed consent, the colonoscope                            was passed under direct vision. Throughout the                            procedure, the patient's blood pressure, pulse, and                            oxygen saturations were monitored continuously. The                            [id number]-HQ190L ([id number]) scope was introduced through                            the anus and advanced to the the cecum, identified                            by appendiceal orifice and ileocecal valve. The                            colonoscopy was performed without difficulty. The                            patient tolerated the procedure well. The  quality                            of the bowel preparation was evaluated using the                            BBPS [facility] Bowel Preparation Scale) with scores                            of: Right Colon = 3, Transverse Colon = 3 and Left                            Colon = 3 (entire mucosa seen well with no residual                             staining, small fragments of stool or opaque                            liquid). The total BBPS score equals 9. Scope In: 9:19:28 AM Scope Out: 9:31:57 AM Scope Withdrawal Time: 0 hours 8 minutes 16 seconds  Total Procedure Duration: 0 hours 12 minutes 29 seconds  Findings:      The perianal and digital rectal examinations were normal.      Non-bleeding internal hemorrhoids were found during endoscopy.      Multiple medium-mouthed diverticula were found in the sigmoid colon.      A 4 mm polyp was found in the sigmoid colon. The polyp was sessile. The       polyp was removed with a cold snare. Resection and retrieval were       complete.      The exam was otherwise without abnormality. Impression:               - Non-bleeding internal hemorrhoids.                           - Diverticulosis in the sigmoid colon.                           - One 4 mm polyp in the sigmoid colon, removed with                            a cold snare. Resected and retrieved.                           - The examination was otherwise normal. Moderate Sedation:      Per Anesthesia Care Recommendation:           - Patient has a contact number available for                            emergencies. The signs and symptoms of potential                            delayed complications were discussed with the  patient. Return to normal activities tomorrow.                            Written discharge instructions were provided to the                            patient.                           - Resume previous diet.                           - Continue present medications.                           - Await pathology results.                           - No repeat colonoscopy due to age.                           - Return to GI clinic PRN. Procedure Code(s):        --- Professional ---                           [phone], Colonoscopy, flexible; with removal of                            tumor(s),  polyp(s), or other lesion(s) by snare                            technique Diagnosis Code(s):        --- Professional ---                           Z12.11, Encounter for screening for malignant                            neoplasm of colon                           K64.8, Other hemorrhoids                           D12.5, Benign neoplasm of sigmoid colon                           K57.30, Diverticulosis of large intestine without                            perforation or abscess without bleeding CPT copyright 2022 American Medical Association. All rights reserved. The codes documented in this report are preliminary and upon coder review may  be revised to meet current compliance requirements. Elon Alas. Abbey Chatters, DO [place] Abbey Chatters, DO 07/15/2022 9:35:47 AM This report has been signed electronically. Number of Addenda: 0

## 2022-07-15 NOTE — Transfer of Care (Signed)
Immediate Anesthesia Transfer of Care Note  Patient: Pamela Walter  Procedure(s) Performed: COLONOSCOPY WITH PROPOFOL POLYPECTOMY  Patient Location: Endoscopy Unit  Anesthesia Type:General  Level of Consciousness: awake, alert , oriented, and patient cooperative  Airway & Oxygen Therapy: Patient Spontanous Breathing  Post-op Assessment: Report given to RN, Post -op Vital signs reviewed and stable, and Patient moving all extremities  Post vital signs: Reviewed and stable  Last Vitals:  Vitals Value Taken Time  BP    Temp    Pulse    Resp    SpO2      Last Pain:  Vitals:   07/15/22 0916  TempSrc:   PainSc: 0-No pain         Complications: No notable events documented.

## 2022-07-15 NOTE — Discharge Instructions (Addendum)
  Colonoscopy Discharge Instructions  Read the instructions outlined below and refer to this sheet in the next few weeks. These discharge instructions provide you with general information on caring for yourself after you leave the hospital. Your doctor may also give you specific instructions. While your treatment has been planned according to the most current medical practices available, unavoidable complications occasionally occur.   ACTIVITY You may resume your regular activity, but move at a slower pace for the next 24 hours.  Take frequent rest periods for the next 24 hours.  Walking will help get rid of the air and reduce the bloated feeling in your belly (abdomen).  No driving for 24 hours (because of the medicine (anesthesia) used during the test).   Do not sign any important legal documents or operate any machinery for 24 hours (because of the anesthesia used during the test).  NUTRITION Drink plenty of fluids.  You may resume your normal diet as instructed by your doctor.  Begin with a light meal and progress to your normal diet. Heavy or fried foods are harder to digest and may make you feel sick to your stomach (nauseated).  Avoid alcoholic beverages for 24 hours or as instructed.  MEDICATIONS You may resume your normal medications unless your doctor tells you otherwise.  WHAT YOU CAN EXPECT TODAY Some feelings of bloating in the abdomen.  Passage of more gas than usual.  Spotting of blood in your stool or on the toilet paper.  IF YOU HAD POLYPS REMOVED DURING THE COLONOSCOPY: No aspirin products for 7 days or as instructed.  No alcohol for 7 days or as instructed.  Eat a soft diet for the next 24 hours.  FINDING OUT THE RESULTS OF YOUR TEST Not all test results are available during your visit. If your test results are not back during the visit, make an appointment with your caregiver to find out the results. Do not assume everything is normal if you have not heard from your  caregiver or the medical facility. It is important for you to follow up on all of your test results.  SEEK IMMEDIATE MEDICAL ATTENTION IF: You have more than a spotting of blood in your stool.  Your belly is swollen (abdominal distention).  You are nauseated or vomiting.  You have a temperature over 101.  You have abdominal pain or discomfort that is severe or gets worse throughout the day.   Your colonoscopy revealed 1 polyp(s) which I removed successfully. This is likely benign, hyperplastic. Await pathology results, my office will contact you. Given your age I think it is reasonable to not pursue further colonoscopy for colon cancer screening.   You also have diverticulosis and internal hemorrhoids. I would recommend increasing fiber in your diet or adding OTC Benefiber/Metamucil. Be sure to drink at least 4 to 6 glasses of water daily. Follow-up with GI as needed.   I hope you have a great rest of your week!  Elon Alas. Abbey Chatters, D.O. Gastroenterology and Hepatology [facility]

## 2022-07-15 NOTE — Anesthesia Preprocedure Evaluation (Addendum)
Anesthesia Evaluation  Patient identified by MRN, date of birth, ID band Patient awake    Reviewed: Allergy & Precautions, H&P , NPO status , Patient's Chart, lab work & pertinent test results  Airway Mallampati: II  TM Distance: >3 FB Neck ROM: Full   Comment: TEMPOROMANDIBULAR JOINT SURGERY Dental  (+) Dental Advisory Given, Partial Upper, Partial Lower   Pulmonary pneumonia, resolved, former smoker   Pulmonary exam normal breath sounds clear to auscultation       Cardiovascular hypertension, Pt. on medications Normal cardiovascular exam+ dysrhythmias  Rhythm:Regular Rate:Normal     Neuro/Psych negative neurological ROS  negative psych ROS   GI/Hepatic negative GI ROS, Neg liver ROS,,,  Endo/Other  negative endocrine ROS    Renal/GU negative Renal ROS  negative genitourinary   Musculoskeletal  (+) Arthritis , Osteoarthritis,    Abdominal   Peds negative pediatric ROS (+)  Hematology negative hematology ROS (+)   Anesthesia Other Findings   Reproductive/Obstetrics negative OB ROS                             Anesthesia Physical Anesthesia Plan  ASA: 2  Anesthesia Plan: General   Post-op Pain Management: Minimal or no pain anticipated   Induction: Intravenous  PONV Risk Score and Plan: 1 and Propofol infusion  Airway Management Planned: Nasal Cannula and Natural Airway  Additional Equipment:   Intra-op Plan:   Post-operative Plan:   Informed Consent: I have reviewed the patients History and Physical, chart, labs and discussed the procedure including the risks, benefits and alternatives for the proposed anesthesia with the patient or authorized representative who has indicated his/her understanding and acceptance.     Dental advisory given  Plan Discussed with: CRNA and Surgeon  Anesthesia Plan Comments:         Anesthesia Quick Evaluation

## 2022-07-15 NOTE — H&P (Signed)
Primary Care Physician:  Lucianne Lei, MD Primary Gastroenterologist:  Dr. Abbey Chatters  Pre-Procedure History & Physical: HPI:  Pamela Walter is a 71 y.o. female is here for a colonoscopy for colon cancer screening purposes.  Patient denies any family history of colorectal cancer.  No melena or hematochezia.  No abdominal pain or unintentional weight loss.  No change in bowel habits.  Overall feels well from a GI standpoint.  Past Medical History:  Diagnosis Date   Arthritis    Hyperlipidemia    borderline   Hypertension    Irregular heart rate    Pneumonia    2000 per patient   Vaginal discharge 06/05/2014    Past Surgical History:  Procedure Laterality Date   APPENDECTOMY     CHOLECYSTECTOMY     30 years ago   COLONOSCOPY  03/01/2011   Procedure: COLONOSCOPY;  Surgeon: Dorothyann Peng, MD;  [facility]: [facility];  Service: Endoscopy;  Laterality: N/A;   TEMPOROMANDIBULAR JOINT SURGERY  1993   TOTAL KNEE ARTHROPLASTY Right 11/16/2020   Procedure: RIGHT TOTAL KNEE ARTHROPLASTY;  Surgeon: Marybelle Killings, MD;  Location: [place];  Service: Orthopedics;  Laterality: Right;    Prior to Admission medications   Medication Sig Start Date End Date Taking? Authorizing Provider  amLODipine (NORVASC) 5 MG tablet Take 5 mg by mouth daily.  04/21/16  Yes [provider]  clonazePAM (KLONOPIN) 0.5 MG tablet Take 0.5 mg by mouth at bedtime as needed (sleep).   Yes [provider]  D3-50 1.25 MG (50000 UT) capsule Take 50,000 Units by mouth once a week. 07/24/20  Yes [provider]  metoprolol tartrate (LOPRESSOR) 25 MG tablet Take 25 mg by mouth daily.   Yes [provider]  rosuvastatin (CRESTOR) 10 MG tablet Take 10 mg by mouth daily. 12/23/20  Yes [provider]    Allergies as of 06/08/2022   (No Known Allergies)    Family History  Problem Relation Age of Onset   Hypertension Mother    Diabetes Mother    Cancer Mother    Hypertension Father     Heart attack Father    Hypertension Sister    Cancer Brother        prostate   Diabetes Son    Diabetes Brother    Dementia Brother     Social History   Socioeconomic History   Marital status: Widowed    Spouse name: Not on file   Number of children: 2   Years of education: Not on file   Highest education level: Not on file  Occupational History   Occupation: retired Therapist, art  Tobacco Use   Smoking status: Former    Packs/day: 0.50    Years: 30.00    Total pack years: 15.00    Types: Cigarettes    Quit date: 2013    Years since quitting: 10.9   Smokeless tobacco: Never  Vaping Use   Vaping Use: Never used  Substance and Sexual Activity   Alcohol use: Yes    Comment: wine once a month   Drug use: No   Sexual activity: Not Currently    Birth control/protection: Post-menopausal  Other Topics Concern   Not on file  Social History Narrative   Not on file   Social Determinants of Health   Financial Resource Strain: Not on file  Food Insecurity: Not on file  Transportation Needs: Not on file  Physical Activity: Not on file  Stress:  Not on file  Social Connections: Not on file  Intimate Partner Violence: Not on file    Review of Systems: See HPI, otherwise negative ROS  Physical Exam: Vital signs in last 24 hours: Temp:  [97.9 F (36.6 C)] 97.9 F (36.6 C) (12/29 0826) Pulse Rate:  [75] 75 (12/29 0826) Resp:  [22] 22 (12/29 0826) BP: (130)/(68) 130/68 (12/29 0826) SpO2:  [99 %] 99 % (12/29 0826) Weight:  [84.8 kg] 84.8 kg (12/29 0826)   General:   Alert,  Well-developed, well-nourished, pleasant and cooperative in NAD Head:  Normocephalic and atraumatic. Eyes:  Sclera clear, no icterus.   Conjunctiva pink. Ears:  Normal auditory acuity. Nose:  No deformity, discharge,  or lesions. Msk:  Symmetrical without gross deformities. Normal posture. Extremities:  Without clubbing or edema. Neurologic:  Alert and  oriented x4;  grossly  normal neurologically. Skin:  Intact without significant lesions or rashes. Psych:  Alert and cooperative. Normal mood and affect.  Impression/Plan: Pamela Walter is here for a colonoscopy to be performed for colon cancer screening purposes.  The risks of the procedure including infection, bleed, or perforation as well as benefits, limitations, alternatives and imponderables have been reviewed with the patient. Questions have been answered. All parties agreeable.

## 2022-07-15 NOTE — Anesthesia Postprocedure Evaluation (Signed)
Anesthesia Post Note  Patient: STANA BAYON  Procedure(s) Performed: COLONOSCOPY WITH PROPOFOL POLYPECTOMY  Patient location during evaluation: Phase II Anesthesia Type: MAC Level of consciousness: awake and alert and oriented Pain management: pain level controlled Vital Signs Assessment: post-procedure vital signs reviewed and stable Respiratory status: spontaneous breathing, nonlabored ventilation and respiratory function stable Cardiovascular status: blood pressure returned to baseline and stable Postop Assessment: no apparent nausea or vomiting Anesthetic complications: no  No notable events documented.   Last Vitals:  Vitals:   07/15/22 0934 07/15/22 0938  BP: (!) 84/35 (!) 107/41  Pulse: 69 72  Resp: 17 (!) 21  Temp: 36.7 C   SpO2: 98% 98%    Last Pain:  Vitals:   07/15/22 0938  TempSrc:   PainSc: 0-No pain                 Gabrelle Roca C Kathe Wirick

## 2022-07-19 LAB — SURGICAL PATHOLOGY

## 2022-07-22 ENCOUNTER — Encounter (HOSPITAL_COMMUNITY): Payer: Self-pay | Admitting: Internal Medicine

## 2022-07-25 NOTE — Anesthesia Postprocedure Evaluation (Signed)
Anesthesia Post Note  Patient: Pamela Walter  Procedure(s) Performed: COLONOSCOPY WITH PROPOFOL POLYPECTOMY  Anesthesia type: General   No notable events documented.   Last Vitals:  Vitals:   07/15/22 0934 07/15/22 0938  BP: (!) 84/35 (!) 107/41  Pulse: 69 72  Resp: 17 (!) 21  Temp: 36.7 C   SpO2: 98% 98%    Last Pain:  Vitals:   07/15/22 0938  TempSrc:   PainSc: 0-No pain                 Yakira Duquette, Arther Dames

## 2022-07-25 NOTE — Addendum Note (Signed)
Addendum  created 07/25/22 1057 by Myna Bright, CRNA   Clinical Note Signed

## 2022-07-26 DIAGNOSIS — H25813 Combined forms of age-related cataract, bilateral: Secondary | ICD-10-CM | POA: Diagnosis not present

## 2022-08-22 DIAGNOSIS — M130 Polyarthritis, unspecified: Secondary | ICD-10-CM | POA: Diagnosis not present

## 2022-08-22 DIAGNOSIS — E1169 Type 2 diabetes mellitus with other specified complication: Secondary | ICD-10-CM | POA: Diagnosis not present

## 2022-08-22 DIAGNOSIS — Z6827 Body mass index (BMI) 27.0-27.9, adult: Secondary | ICD-10-CM | POA: Diagnosis not present

## 2022-08-22 DIAGNOSIS — I10 Essential (primary) hypertension: Secondary | ICD-10-CM | POA: Diagnosis not present

## 2022-08-22 DIAGNOSIS — R7309 Other abnormal glucose: Secondary | ICD-10-CM | POA: Diagnosis not present

## 2022-08-22 DIAGNOSIS — E782 Mixed hyperlipidemia: Secondary | ICD-10-CM | POA: Diagnosis not present

## 2022-09-15 ENCOUNTER — Encounter: Payer: Self-pay | Admitting: Radiology

## 2022-09-20 DIAGNOSIS — H25813 Combined forms of age-related cataract, bilateral: Secondary | ICD-10-CM | POA: Diagnosis not present

## 2022-10-10 IMAGING — MG DIGITAL SCREENING BILAT W/ TOMO W/ CAD
6 of 10 series · 6 of 30 positions shown · non-contrast
Comparison: Previous exam(s).

CLINICAL DATA: Screening.

EXAM:
DIGITAL SCREENING BILATERAL MAMMOGRAM WITH TOMO AND CAD

[L CC synth-2D]
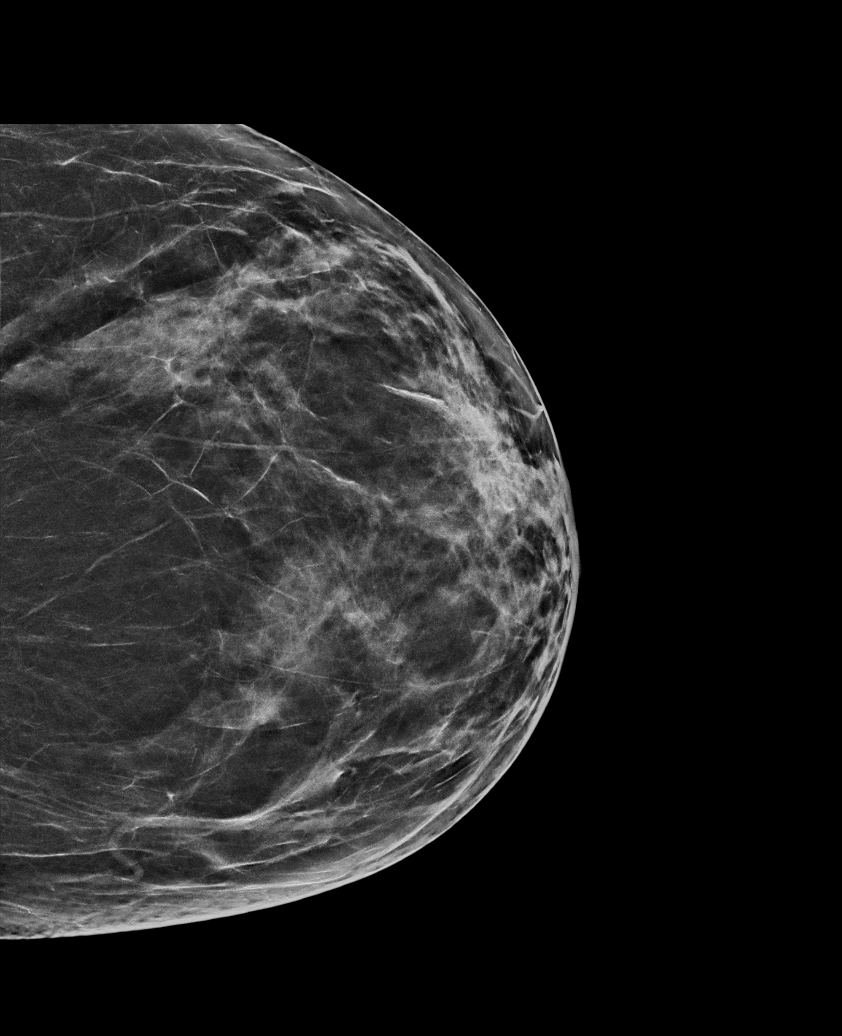

[L MLO synth-2D (1 of 2)]
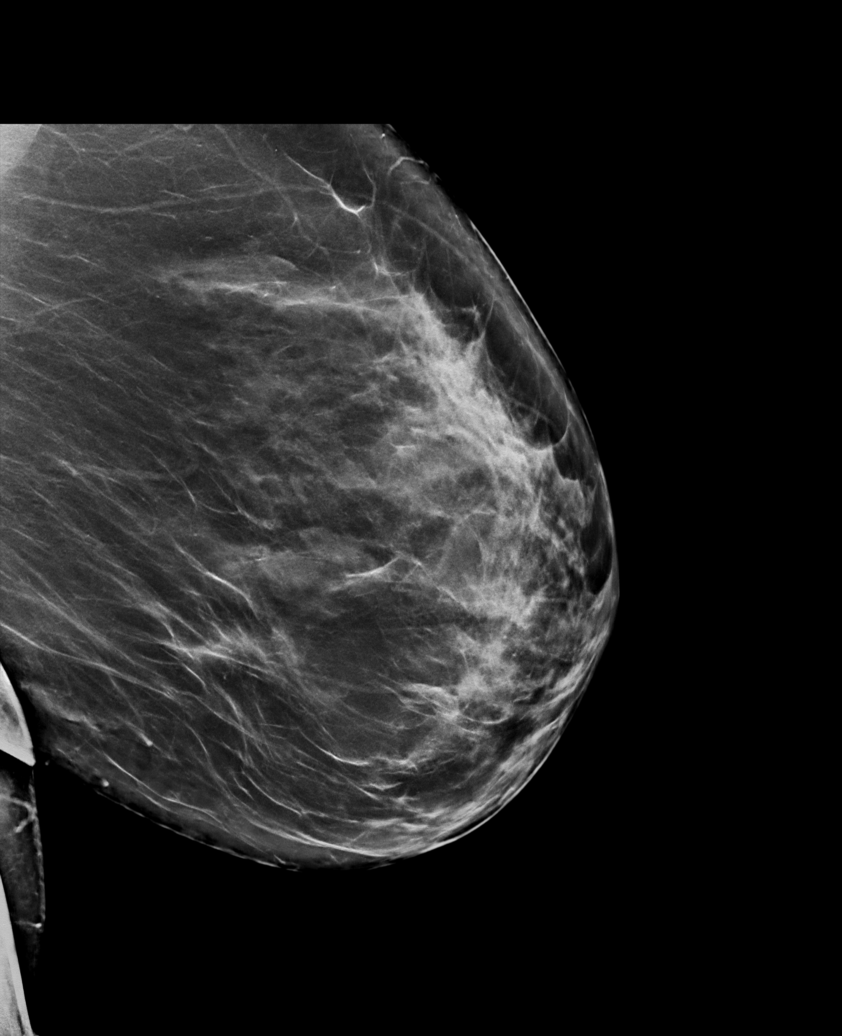

[L MLO synth-2D (2 of 2)]
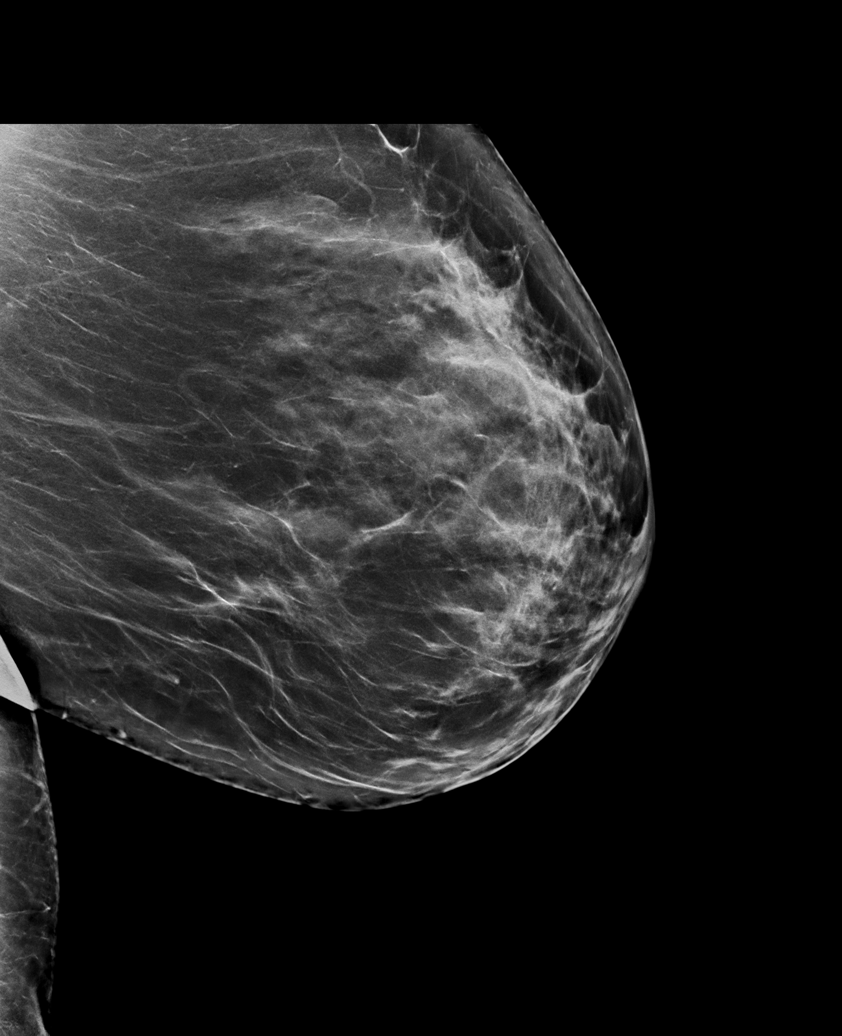

[R CC synth-2D]
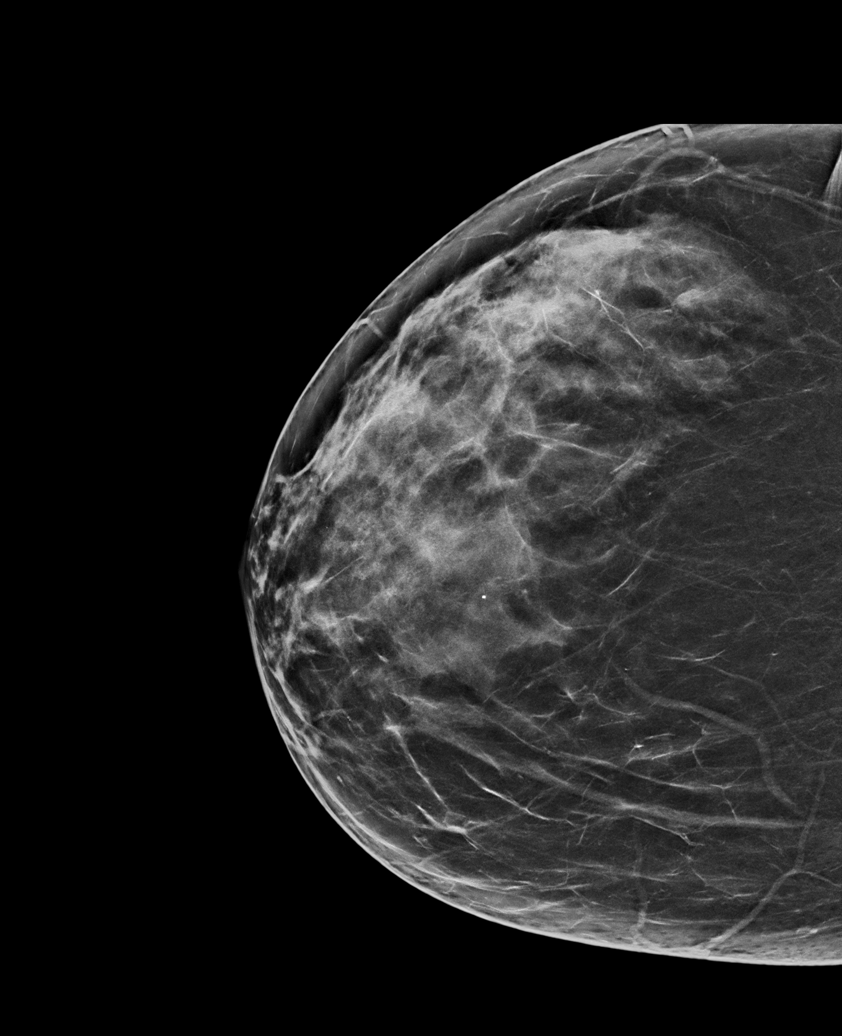

[R MLO synth-2D]
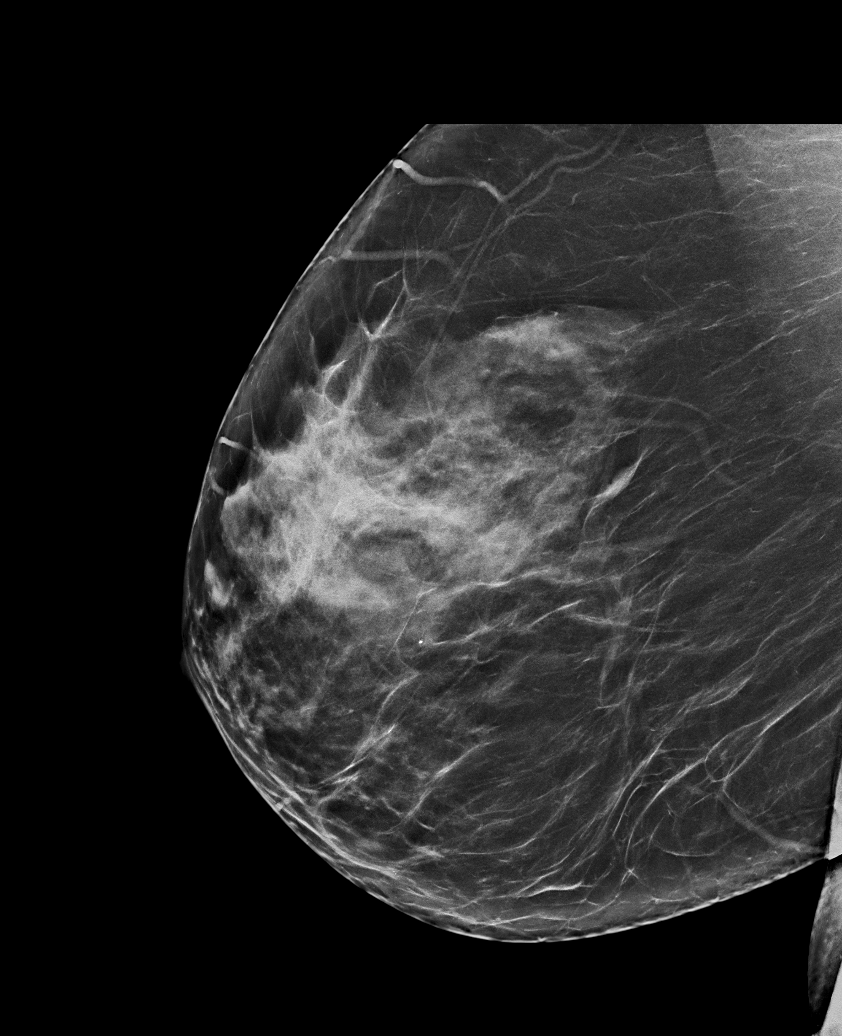

[L CC tomo · tomo slice 37/73.0]
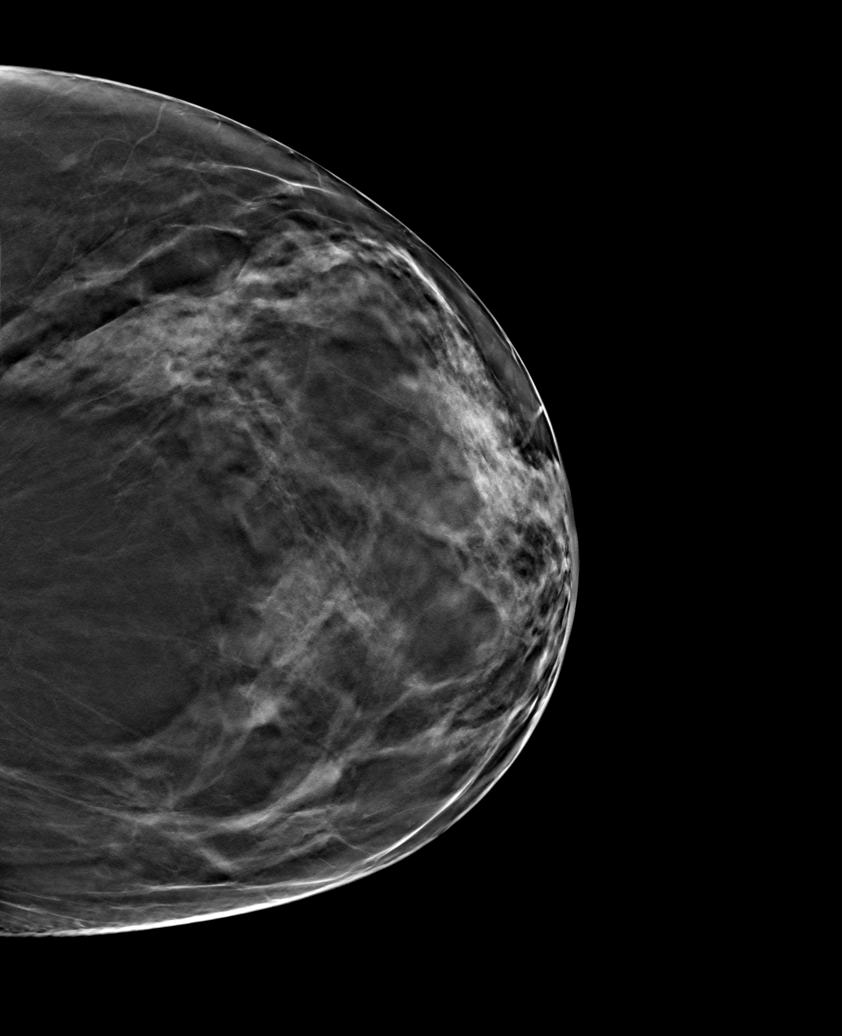

[6 of 30 positions shown; findings below may reference images not displayed]

ACR Breast Density Category c: The breast tissue is heterogeneously
dense, which may obscure small masses.
FINDINGS: There are no findings suspicious for malignancy. Images were
processed with CAD.
IMPRESSION: No mammographic evidence of malignancy. A result letter of this
screening mammogram will be mailed directly to the patient.

RECOMMENDATION:
Screening mammogram in one year. (Code:[id number])

BI-RADS CATEGORY  1: Negative.

## 2022-11-02 NOTE — H&P (Signed)
Surgical History & Physical  Patient Name: Pamela Walter  DOB: [birth year]  Surgery: Cataract extraction with intraocular lens implant phacoemulsification; Right Eye Surgeon: Pecolia Ades MD Surgery Date: 11/11/2022 Pre-Op Date: 09/20/2022  HPI: A 60 Yr. old female patient 1. 1. The patient complains of difficulty when driving due to glare from headlights or sun, which began 1 year ago. Both eyes are affected. The episode is constant. The condition's severity is worsening. This is negatively affecting the patient's quality of life and the patient is unable to function adequately in life with the current level of vision.  Medical History: Cataracts Arthritis Heart Problem High Blood Pressure LDL  Review of Systems Cardiovascular High Blood Pressure All recorded systems are negative except as noted above.  Social Former smoker of Cigarettes   Medication Amlodipine, Clonazepam, Metoprolol, Rosuvastatin  Sx/Procedures Knee Replacement - Rt  Drug Allergies  NKDA  History & Physical: Heent: cataracts NECK: supple without bruits LUNGS: lungs clear to auscultation CV: regular rate and rhythm Abdomen: soft and non-tender  Impression & Plan: Assessment: 1.  Hyperopia ; Both Eyes (H52.03) 2.  CATARACT AGE-RELATED COMBINED FORMS; Both Eyes (H25.813)  Plan: 1.  Wears bifocal full time currently  2.  Cataracts are visually significant and account for the patient's complaints. Discussed all risks, benefits, procedures and recovery, including infection, loss of vision and eye, need for glasses after surgery or additional procedures. Patient understands changing glasses will not improve vision. Patient indicated understanding of procedure. All questions answered. Patient desires to have surgery, recommend phacoemulsification with intraocular lens. Patient to have preliminary testing necessary (Argos/IOL Master, Mac OCT, TOPO) Educational materials provided.  IOL calcs show  higher astigmatism OS than expected. will repeat calcs in 2 weeks  Plan: - Proceed with cataract surgery OD followed by OS - RayOne lens, target first minus lens - good dilation - ok with lying flat, no prior eye surgeries, no DM, no fuchs

## 2022-11-04 DIAGNOSIS — H2511 Age-related nuclear cataract, right eye: Secondary | ICD-10-CM | POA: Diagnosis not present

## 2022-11-07 ENCOUNTER — Encounter (HOSPITAL_COMMUNITY)
Admission: RE | Admit: 2022-11-07 | Discharge: 2022-11-07 | Disposition: A | Discharge status: Home / Self Care | Payer: Medicare HMO | Source: Ambulatory Visit | Attending: Optometry | Admitting: Optometry

## 2022-11-07 ENCOUNTER — Encounter (HOSPITAL_COMMUNITY): Payer: Self-pay

## 2022-11-07 HISTORY — DX: Cardiac arrhythmia, unspecified: I49.9

## 2022-11-11 ENCOUNTER — Ambulatory Visit (HOSPITAL_COMMUNITY): Payer: Medicare HMO | Admitting: Anesthesiology

## 2022-11-11 ENCOUNTER — Encounter (HOSPITAL_COMMUNITY)
Admission: RE | Disposition: A | Discharge status: Home / Self Care | Payer: Self-pay | Source: Home / Self Care | Attending: Optometry

## 2022-11-11 ENCOUNTER — Ambulatory Visit (HOSPITAL_COMMUNITY)
Admission: RE | Admit: 2022-11-11 | Discharge: 2022-11-11 | Disposition: A | Discharge status: Home / Self Care | Payer: Medicare HMO | Attending: Optometry | Admitting: Optometry

## 2022-11-11 ENCOUNTER — Encounter (HOSPITAL_COMMUNITY): Payer: Self-pay | Admitting: Optometry

## 2022-11-11 DIAGNOSIS — I10 Essential (primary) hypertension: Secondary | ICD-10-CM | POA: Insufficient documentation

## 2022-11-11 DIAGNOSIS — Z09 Encounter for follow-up examination after completed treatment for conditions other than malignant neoplasm: Secondary | ICD-10-CM | POA: Insufficient documentation

## 2022-11-11 DIAGNOSIS — H25811 Combined forms of age-related cataract, right eye: Secondary | ICD-10-CM

## 2022-11-11 DIAGNOSIS — Z87891 Personal history of nicotine dependence: Secondary | ICD-10-CM | POA: Diagnosis not present

## 2022-11-11 DIAGNOSIS — J189 Pneumonia, unspecified organism: Secondary | ICD-10-CM

## 2022-11-11 DIAGNOSIS — Z79899 Other long term (current) drug therapy: Secondary | ICD-10-CM | POA: Diagnosis not present

## 2022-11-11 DIAGNOSIS — H5203 Hypermetropia, bilateral: Secondary | ICD-10-CM | POA: Diagnosis not present

## 2022-11-11 DIAGNOSIS — I499 Cardiac arrhythmia, unspecified: Secondary | ICD-10-CM | POA: Diagnosis not present

## 2022-11-11 DIAGNOSIS — H2511 Age-related nuclear cataract, right eye: Secondary | ICD-10-CM | POA: Insufficient documentation

## 2022-11-11 HISTORY — PX: CATARACT EXTRACTION W/PHACO: SHX586

## 2022-11-11 SURGERY — PHACOEMULSIFICATION, CATARACT, WITH IOL INSERTION
Anesthesia: Monitor Anesthesia Care | Site: Eye | Laterality: Right

## 2022-11-11 MED ORDER — MIDAZOLAM HCL 2 MG/2ML IJ SOLN
INTRAMUSCULAR | Status: AC
  Filled 2022-11-11: qty 2

## 2022-11-11 MED ORDER — BSS IO SOLN
INTRAOCULAR | Status: DC | PRN
  Administered 2022-11-11: 15 mL via INTRAOCULAR

## 2022-11-11 MED ORDER — LIDOCAINE HCL 3.5 % OP GEL
1.0000 | Freq: Once | OPHTHALMIC | Status: AC
  Administered 2022-11-11: 1 via OPHTHALMIC

## 2022-11-11 MED ORDER — STERILE WATER FOR IRRIGATION IR SOLN
Status: DC | PRN
  Administered 2022-11-11: 250 mL

## 2022-11-11 MED ORDER — LIDOCAINE HCL (PF) 1 % IJ SOLN
INTRAMUSCULAR | Status: DC | PRN
  Administered 2022-11-11: 1 mL

## 2022-11-11 MED ORDER — TETRACAINE HCL 0.5 % OP SOLN
1.0000 [drp] | OPHTHALMIC | Status: AC
  Administered 2022-11-11 (×3): 1 [drp] via OPHTHALMIC

## 2022-11-11 MED ORDER — PHENYLEPHRINE HCL 2.5 % OP SOLN
1.0000 [drp] | OPHTHALMIC | Status: AC
  Administered 2022-11-11 (×3): 1 [drp] via OPHTHALMIC

## 2022-11-11 MED ORDER — SIGHTPATH DOSE#1 NA HYALUR & NA CHOND-NA HYALUR IO KIT
PACK | INTRAOCULAR | Status: DC | PRN
  Administered 2022-11-11: 1 via OPHTHALMIC

## 2022-11-11 MED ORDER — PHENYLEPHRINE-KETOROLAC 1-0.3 % IO SOLN
INTRAOCULAR | Status: AC
  Filled 2022-11-11: qty 4

## 2022-11-11 MED ORDER — PHENYLEPHRINE-KETOROLAC 1-0.3 % IO SOLN
INTRAOCULAR | Status: DC | PRN
  Administered 2022-11-11: 500 mL via OPHTHALMIC

## 2022-11-11 MED ORDER — NEOMYCIN-POLYMYXIN-DEXAMETH 3.5-10000-0.1 OP SUSP
OPHTHALMIC | Status: DC | PRN
  Administered 2022-11-11: 2 [drp] via OPHTHALMIC

## 2022-11-11 MED ORDER — TROPICAMIDE 1 % OP SOLN
1.0000 [drp] | OPHTHALMIC | Status: AC
  Administered 2022-11-11 (×3): 1 [drp] via OPHTHALMIC

## 2022-11-11 MED ORDER — POVIDONE-IODINE 5 % OP SOLN
OPHTHALMIC | Status: DC | PRN
  Administered 2022-11-11: 1 via OPHTHALMIC

## 2022-11-11 MED ORDER — MIDAZOLAM HCL 5 MG/5ML IJ SOLN: INTRAMUSCULAR | Status: DC | PRN

## 2022-11-11 MED ORDER — MIDAZOLAM HCL 5 MG/5ML IJ SOLN
INTRAMUSCULAR | Status: DC | PRN
  Administered 2022-11-11: 2 mg via INTRAVENOUS

## 2022-11-11 SURGICAL SUPPLY — 15 items
CATARACT SUITE SIGHTPATH (MISCELLANEOUS) ×1 IMPLANT
CLOTH BEACON ORANGE TIMEOUT ST (SAFETY) ×1 IMPLANT
DRSG TEGADERM 4X4.75 (GAUZE/BANDAGES/DRESSINGS) ×1 IMPLANT
EYE SHIELD UNIVERSAL CLEAR (GAUZE/BANDAGES/DRESSINGS) IMPLANT
FEE CATARACT SUITE SIGHTPATH (MISCELLANEOUS) ×1 IMPLANT
GLOVE BIOGEL PI IND STRL 7.0 (GLOVE) ×2 IMPLANT
LENS IOL RAYNER 21.5 (Intraocular Lens) ×1 IMPLANT
LENS IOL RAYONE EMV 21.5 (Intraocular Lens) IMPLANT
NDL HYPO 18GX1.5 BLUNT FILL (NEEDLE) ×1 IMPLANT
NEEDLE HYPO 18GX1.5 BLUNT FILL (NEEDLE) ×1 IMPLANT
PAD ARMBOARD 7.5X6 YLW CONV (MISCELLANEOUS) ×1 IMPLANT
RING MALYGIN 7.0 (MISCELLANEOUS) IMPLANT
SYR TB 1ML LL NO SAFETY (SYRINGE) ×1 IMPLANT
TAPE SURG TRANSPORE 1 IN (GAUZE/BANDAGES/DRESSINGS) IMPLANT
WATER STERILE IRR 250ML POUR (IV SOLUTION) ×1 IMPLANT

## 2022-11-11 NOTE — Discharge Instructions (Signed)
Please discharge patient when stable, will follow up today with Dr. Neeva Trew at the [facility] office immediately following discharge.  Leave shield in place until visit.  All paperwork with discharge instructions will be given at the office.  [facility] Address:  [address]  [place], [place] [address]  Dr. Yolanda Dockendorf's Phone: [phone]  

## 2022-11-11 NOTE — Op Note (Signed)
Date of procedure: 11/11/22  Pre-operative diagnosis: Visually significant age-related nuclear cataract, Right Eye (H25.11)  Post-operative diagnosis: Visually significant age-related nuclear cataract, Right Eye  Procedure: Removal of cataract via phacoemulsification and insertion of intra-ocular lens Rayner [id number] +21.5D into the capsular bag of the Right Eye  Attending surgeon: Pecolia Ades, MD  Anesthesia: MAC, Topical Akten  Complications: None  Estimated Blood Loss: <59mL (minimal)  Specimens: None  Implants:  Implant Name Type Inv. Item Serial No. Manufacturer Lot No. LRB No. Used Action  LENS IOL RAYNER 21.5 - [id number] Intraocular Lens LENS IOL RAYNER 21.5  SIGHTPATH [id number] Right 1 Implanted    Indications:  Visually significant age-related cataract, Right Eye  Procedure:  The patient was seen and identified in the pre-operative area. The operative eye was identified and dilated.  The operative eye was marked.  Topical anesthesia was administered to the operative eye.     The patient was then to the operative suite and placed in the supine position.  A timeout was performed confirming the patient, procedure to be performed, and all other relevant information.   The patient's face was prepped and draped in the usual fashion for intra-ocular surgery.  A lid speculum was placed into the operative eye and the surgical microscope moved into place and focused.  A superotemporal paracentesis was created using a 20 gauge paracentesis blade.  BSS mixed with Omidria, followed by 1% lidocaine was injected into the anterior chamber.  Viscoelastic was injected into the anterior chamber.  A temporal clear-corneal main wound incision was created using a 2.28mm microkeratome.  A continuous curvilinear capsulorrhexis was initiated using an irrigating cystitome and completed using capsulorrhexis forceps.  Hydrodissection and hydrodeliniation were performed.  Viscoelastic was injected into  the anterior chamber.  A phacoemulsification handpiece and a chopper as a second instrument were used to remove the nucleus and epinucleus. The irrigation/aspiration handpiece was used to remove any remaining cortical material.   The capsular bag was reinflated with viscoelastic, checked, and found to be intact.  The intraocular lens was inserted into the capsular bag.  The irrigation/aspiration handpiece was used to remove any remaining viscoelastic.  The clear corneal wound and paracentesis wounds were then hydrated and checked with Weck-Cels to be watertight.  The lid-speculum and drape was removed, and the patient's face was cleaned with a wet and dry 4x4.  Moxifloxacin drops were instilled onto the eye. A clear shield was taped over the eye. The patient was taken to the post-operative care unit in good condition, having tolerated the procedure well.  Post-Op Instructions: The patient will follow up at [facility] for a same day post-operative evaluation and will receive all other orders and instructions.

## 2022-11-11 NOTE — Anesthesia Preprocedure Evaluation (Signed)
Anesthesia Evaluation  Patient identified by MRN, date of birth, ID band Patient awake    Reviewed: Allergy & Precautions, H&P , NPO status , Patient's Chart, lab work & pertinent test results, reviewed documented beta blocker date and time   Airway Mallampati: II  TM Distance: >3 FB Neck ROM: Full    Dental  (+) Dental Advisory Given, Missing, Upper Dentures, Partial Lower   Pulmonary pneumonia, former smoker   Pulmonary exam normal breath sounds clear to auscultation       Cardiovascular Exercise Tolerance: Good hypertension, Pt. on medications and Pt. on home beta blockers Normal cardiovascular exam+ dysrhythmias  Rhythm:Regular Rate:Normal  12-Nov-2020 11:28:02 [facility] ROUTINE RECORD 01-29-1951 (70 yr) Female Black Vent. rate 74 BPM PR interval 148 ms QRS duration 128 ms QT/QTcB 394/437 ms P-R-T axes 69 -18 13 Normal sinus rhythm Right bundle branch block No previous tracing Confirmed by Croitoru, Mihai [phone]) on 11/13/2020 3:41:08 PM   Neuro/Psych negative neurological ROS  negative psych ROS   GI/Hepatic negative GI ROS, Neg liver ROS,,,  Endo/Other  negative endocrine ROS    Renal/GU negative Renal ROS  negative genitourinary   Musculoskeletal  (+) Arthritis , Osteoarthritis,    Abdominal   Peds negative pediatric ROS (+)  Hematology negative hematology ROS (+)   Anesthesia Other Findings   Reproductive/Obstetrics negative OB ROS                             Anesthesia Physical Anesthesia Plan  ASA: 2  Anesthesia Plan: MAC   Post-op Pain Management: Minimal or no pain anticipated   Induction:   PONV Risk Score and Plan: Treatment may vary due to age or medical condition  Airway Management Planned: Nasal Cannula and Natural Airway  Additional Equipment:   Intra-op Plan:   Post-operative Plan:   Informed Consent: I have reviewed the  patients History and Physical, chart, labs and discussed the procedure including the risks, benefits and alternatives for the proposed anesthesia with the patient or authorized representative who has indicated his/her understanding and acceptance.     Dental advisory given  Plan Discussed with: CRNA and Surgeon  Anesthesia Plan Comments:         Anesthesia Quick Evaluation

## 2022-11-11 NOTE — Interval H&P Note (Signed)
History and Physical Interval Note:  11/11/2022 8:24 AM  The H and P was reviewed and updated. The patient was examined.  No changes were found after exam.  The surgical eye was marked.    Pamela Walter

## 2022-11-11 NOTE — Anesthesia Postprocedure Evaluation (Signed)
Anesthesia Post Note  Patient: JENNIER SCHISSLER  Procedure(s) Performed: CATARACT EXTRACTION PHACO AND INTRAOCULAR LENS PLACEMENT (IOC) (Right: Eye)  Patient location during evaluation: Phase II Anesthesia Type: MAC Level of consciousness: awake and alert and oriented Pain management: pain level controlled Vital Signs Assessment: post-procedure vital signs reviewed and stable Respiratory status: spontaneous breathing, nonlabored ventilation and respiratory function stable Cardiovascular status: blood pressure returned to baseline and stable Postop Assessment: no apparent nausea or vomiting Anesthetic complications: no  No notable events documented.   Last Vitals:  Vitals:   11/11/22 0821 11/11/22 0916  BP: (!) 118/47 (!) 125/55  Pulse: 64 63  Resp: 16 12  Temp: 36.7 C 36.6 C  SpO2: 100% 100%    Last Pain:  Vitals:   11/11/22 0916  TempSrc: Oral  PainSc: 0-No pain                 Katherina Wimer C Carson Bogden

## 2022-11-11 NOTE — Transfer of Care (Signed)
Immediate Anesthesia Transfer of Care Note  Patient: Pamela Walter  Procedure(s) Performed: CATARACT EXTRACTION PHACO AND INTRAOCULAR LENS PLACEMENT (IOC) (Right: Eye)  Patient Location: PACU  Anesthesia Type:MAC  Level of Consciousness: awake, alert , oriented, and patient cooperative  Airway & Oxygen Therapy: Patient Spontanous Breathing  Post-op Assessment: Report given to RN, Post -op Vital signs reviewed and stable, and Patient moving all extremities X 4  Post vital signs: Reviewed and stable  Last Vitals:  Vitals Value Taken Time  BP 125/55 11/11/22 0916  Temp 36.6 C 11/11/22 0916  Pulse 63 11/11/22 0916  Resp 12 11/11/22 0916  SpO2 100 % 11/11/22 0916    Last Pain:  Vitals:   11/11/22 0916  TempSrc: Oral  PainSc: 0-No pain         Complications: No notable events documented.

## 2022-11-16 ENCOUNTER — Encounter (HOSPITAL_COMMUNITY): Payer: Self-pay | Admitting: Optometry

## 2022-11-22 DIAGNOSIS — H25812 Combined forms of age-related cataract, left eye: Secondary | ICD-10-CM | POA: Diagnosis not present

## 2022-12-19 DIAGNOSIS — E559 Vitamin D deficiency, unspecified: Secondary | ICD-10-CM | POA: Diagnosis not present

## 2022-12-19 DIAGNOSIS — I10 Essential (primary) hypertension: Secondary | ICD-10-CM | POA: Diagnosis not present

## 2022-12-19 DIAGNOSIS — Z6828 Body mass index (BMI) 28.0-28.9, adult: Secondary | ICD-10-CM | POA: Diagnosis not present

## 2022-12-19 DIAGNOSIS — E782 Mixed hyperlipidemia: Secondary | ICD-10-CM | POA: Diagnosis not present

## 2022-12-19 DIAGNOSIS — E1169 Type 2 diabetes mellitus with other specified complication: Secondary | ICD-10-CM | POA: Diagnosis not present

## 2022-12-21 ENCOUNTER — Other Ambulatory Visit (HOSPITAL_COMMUNITY): Payer: Self-pay | Admitting: Family Medicine

## 2022-12-21 DIAGNOSIS — Z1231 Encounter for screening mammogram for malignant neoplasm of breast: Secondary | ICD-10-CM

## 2022-12-28 ENCOUNTER — Encounter (HOSPITAL_COMMUNITY): Payer: Self-pay

## 2022-12-28 ENCOUNTER — Ambulatory Visit (HOSPITAL_COMMUNITY)
Admission: RE | Admit: 2022-12-28 | Discharge: 2022-12-28 | Disposition: A | Discharge status: Home / Self Care | Payer: Medicare HMO | Source: Ambulatory Visit | Attending: Family Medicine | Admitting: Family Medicine

## 2022-12-28 DIAGNOSIS — Z1231 Encounter for screening mammogram for malignant neoplasm of breast: Secondary | ICD-10-CM | POA: Insufficient documentation

## 2023-03-11 IMAGING — DX DG KNEE 1-2V PORT*R*
2 series · 2 of 2 positions shown · non-contrast
Comparison: Portable exam 2958 hours compared to 01/23/2006

CLINICAL DATA: Postop knee arthroplasty

EXAM:
PORTABLE RIGHT KNEE - 1-2 VIEW

[knee ap]
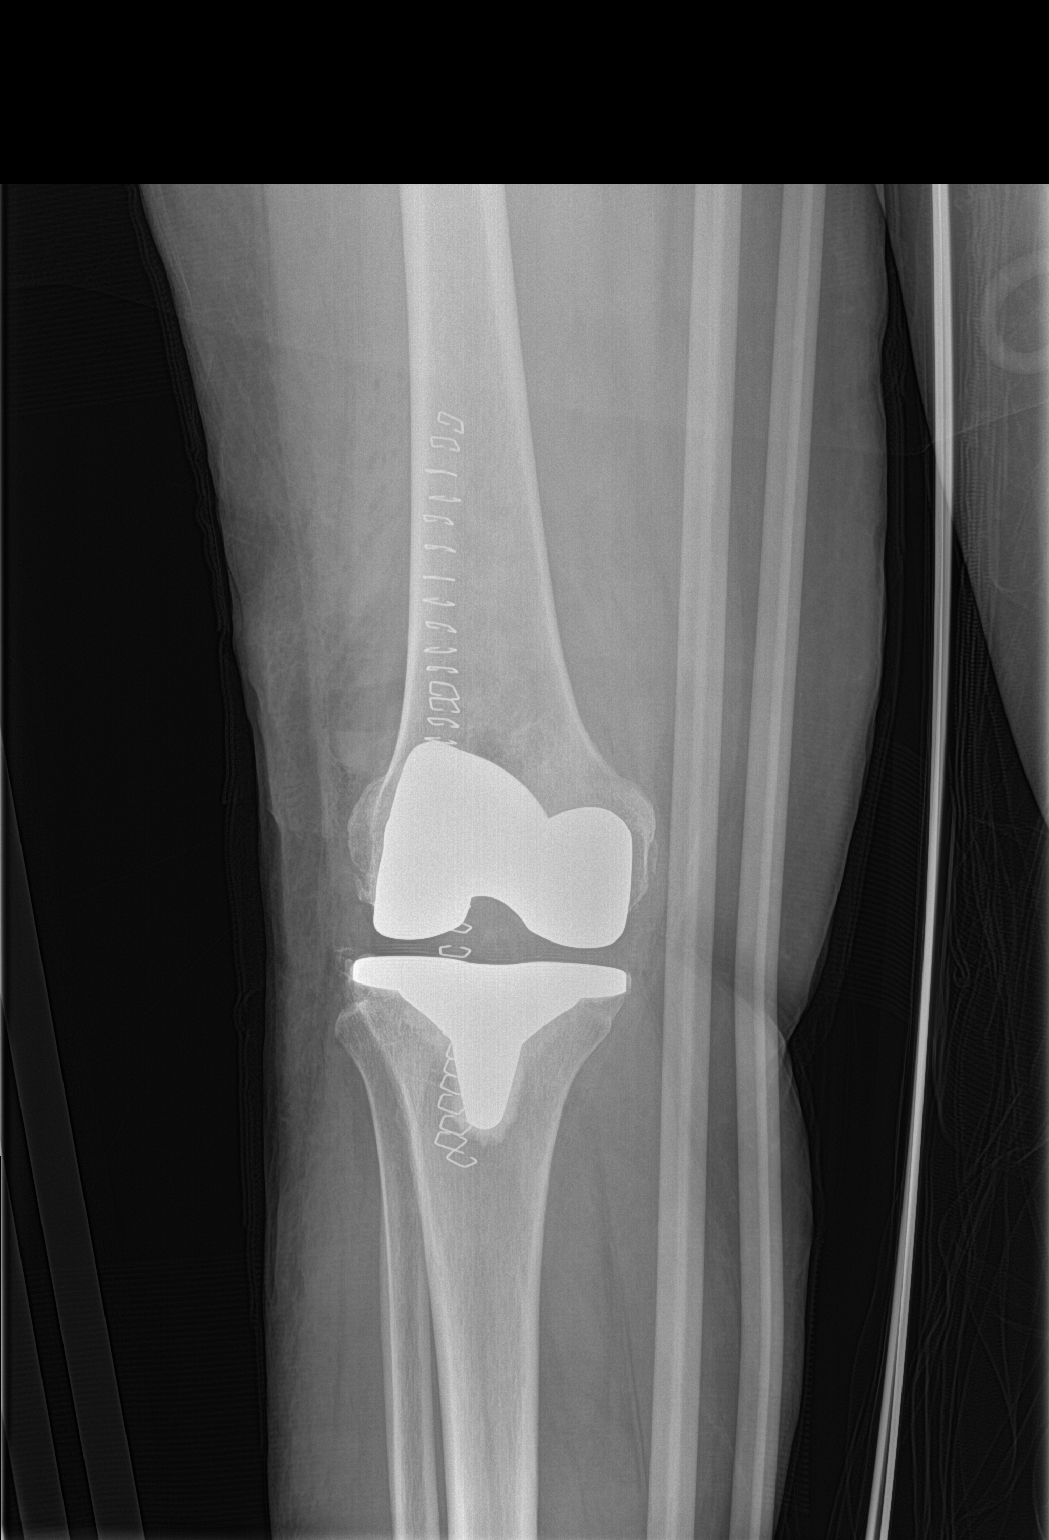

[knee lat]
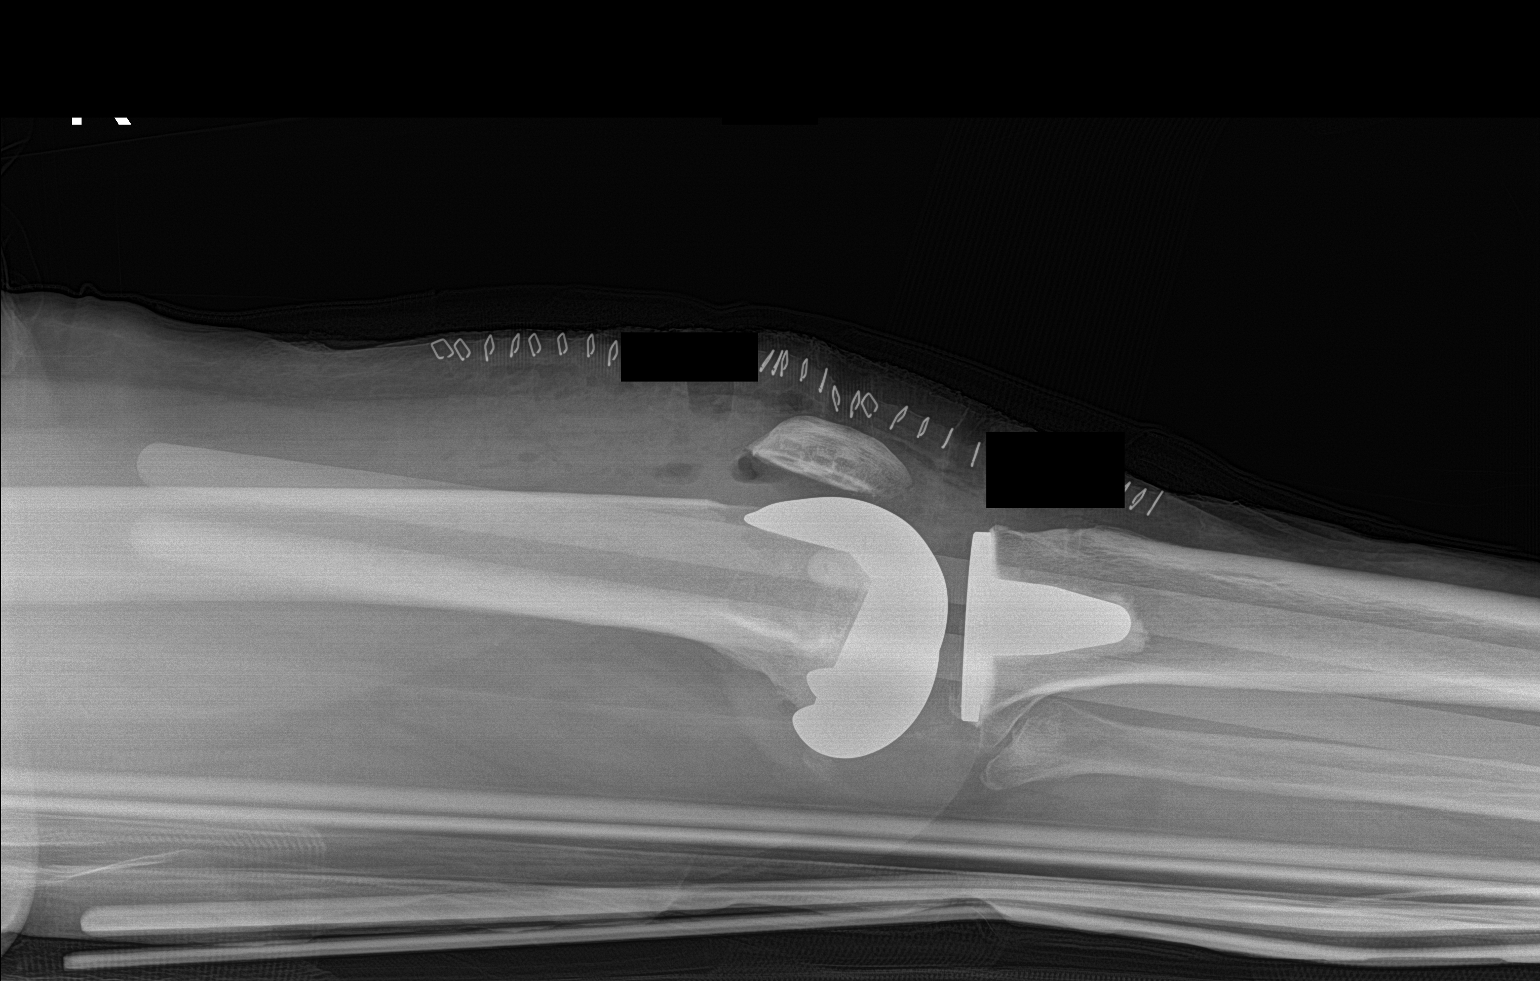

[2 of 2 positions shown; findings below may reference images not displayed]

FINDINGS: Osseous demineralization.

Components of RIGHT knee prosthesis identified.

No fracture, dislocation, or bone destruction.

Anterior skin clips and expected postsurgical changes of soft
tissues.
IMPRESSION: RIGHT knee prosthesis without acute abnormalities.

## 2023-05-10 DIAGNOSIS — H25812 Combined forms of age-related cataract, left eye: Secondary | ICD-10-CM | POA: Diagnosis not present

## 2023-05-10 DIAGNOSIS — H59031 Cystoid macular edema following cataract surgery, right eye: Secondary | ICD-10-CM | POA: Diagnosis not present

## 2023-06-22 NOTE — H&P (Signed)
Surgical History & Physical  Patient Name: Pamela Walter  DOB: 1950/12/28  Surgery: Cataract extraction with intraocular lens implant phacoemulsification; Left Eye Surgeon: Pecolia Ades MD Surgery Date: 06/30/2023 Pre-Op Date: 05/10/2023  HPI: A 58 Yr. old female patient 1. The patient complains of difficulty when driving at night due to glare, difficulties reading road signs, and reading fine print, which began for an unknown amount of time. The left eye is affected. The symptoms are constant. The condition's severity is worsening. This is negatively affecting the patient's quality of life and the patient is unable to function adequately in life with the current level of vision. Patient would like to proceed with cataract sx OS. Last week she had one day where she seen black dots in vision. Appears to be OS only. It only happened that one day. Denies FOLs.  Medical History: Cataracts  Arthritis Heart Problem High Blood Pressure LDL  Review of Systems Cardiovascular High Blood Pressure, A-fib All recorded systems are negative except as noted above.  Social Former smoker of Cigarettes   Medication metoprolol succinate ,  amlodipine ,  clonazepam ,  rosuvastatin ,  ketorolac ,  prednisolone acetate   Sx/Procedures Phaco c IOL OD,  Knee Replacement - Rt  Drug Allergies  NKDA  History & Physical: Heent: cataract NECK: supple without bruits LUNGS: lungs clear to auscultation CV: regular rate and rhythm Abdomen: soft and non-tender  Impression & Plan: Assessment: 1.  CATARACT EXTRACTION STATUS; Right Eye (Z98.41) 2.  INTRAOCULAR LENS IOL (Z96.1) 3.  CATARACT AGE-RELATED COMBINED FORMS; Left Eye (H25.812) 4.  CME PSEUDOPHAKIC; Right Eye (H59.031)  Plan: 1.  Well healed OD. Ready to proceed with OS. Glasses per Dr. Daphine Deutscher.  2.  Clear visual axis.  3.  Cataracts are visually significant and account for the patient's complaints. Discussed all risks, benefits,  procedures and recovery, including infection, loss of vision and eye, need for glasses after surgery or additional procedures. Patient understands changing glasses will not improve vision. Patient indicated understanding of procedure. All questions answered. Patient desires to have surgery, recommend phacoemulsification with intraocular lens. Patient to have preliminary testing necessary (Argos/IOL Master, Mac OCT, TOPO) Educational materials provided.  Plan: - Proceed with cataract surgery OS when ready - RayOne lens, compare calcs from last 2 measurements - good dilation - ok with lying flat, no prior eye surgeries, no DM, no fuchs  4.  New Findings, prognosis and treatment options reviewed. Begin Pred 1% QID OD Begin Ketorolac QID OD

## 2023-06-23 DIAGNOSIS — H2512 Age-related nuclear cataract, left eye: Secondary | ICD-10-CM | POA: Diagnosis not present

## 2023-06-28 ENCOUNTER — Other Ambulatory Visit: Payer: Self-pay

## 2023-06-28 ENCOUNTER — Encounter (HOSPITAL_COMMUNITY)
Admission: RE | Admit: 2023-06-28 | Discharge: 2023-06-28 | Disposition: A | Discharge status: Home / Self Care | Payer: Medicare HMO | Source: Ambulatory Visit | Attending: Optometry | Admitting: Optometry

## 2023-06-28 ENCOUNTER — Encounter (HOSPITAL_COMMUNITY): Payer: Self-pay

## 2023-06-30 ENCOUNTER — Encounter (HOSPITAL_COMMUNITY)
Admission: RE | Disposition: A | Discharge status: Home / Self Care | Payer: Self-pay | Source: Home / Self Care | Attending: Optometry

## 2023-06-30 ENCOUNTER — Ambulatory Visit (HOSPITAL_COMMUNITY)
Admission: RE | Admit: 2023-06-30 | Discharge: 2023-06-30 | Disposition: A | Discharge status: Home / Self Care | Payer: Medicare HMO | Attending: Optometry | Admitting: Optometry

## 2023-06-30 ENCOUNTER — Ambulatory Visit (HOSPITAL_COMMUNITY): Payer: Medicare HMO | Admitting: Anesthesiology

## 2023-06-30 DIAGNOSIS — Z9841 Cataract extraction status, right eye: Secondary | ICD-10-CM | POA: Insufficient documentation

## 2023-06-30 DIAGNOSIS — Z87891 Personal history of nicotine dependence: Secondary | ICD-10-CM | POA: Insufficient documentation

## 2023-06-30 DIAGNOSIS — I499 Cardiac arrhythmia, unspecified: Secondary | ICD-10-CM | POA: Diagnosis not present

## 2023-06-30 DIAGNOSIS — H2512 Age-related nuclear cataract, left eye: Secondary | ICD-10-CM

## 2023-06-30 DIAGNOSIS — Z961 Presence of intraocular lens: Secondary | ICD-10-CM | POA: Insufficient documentation

## 2023-06-30 DIAGNOSIS — I10 Essential (primary) hypertension: Secondary | ICD-10-CM | POA: Diagnosis not present

## 2023-06-30 HISTORY — PX: CATARACT EXTRACTION W/PHACO: SHX586

## 2023-06-30 SURGERY — PHACOEMULSIFICATION, CATARACT, WITH IOL INSERTION
Anesthesia: Monitor Anesthesia Care | Laterality: Left

## 2023-06-30 MED ORDER — MIDAZOLAM HCL 5 MG/5ML IJ SOLN
INTRAMUSCULAR | Status: DC | PRN
  Administered 2023-06-30: 1 mg via INTRAVENOUS

## 2023-06-30 MED ORDER — PHENYLEPHRINE-KETOROLAC 1-0.3 % IO SOLN
INTRAOCULAR | Status: DC | PRN
  Administered 2023-06-30: 500 mL via OPHTHALMIC

## 2023-06-30 MED ORDER — PHENYLEPHRINE HCL 2.5 % OP SOLN
1.0000 [drp] | OPHTHALMIC | Status: AC | PRN
  Administered 2023-06-30 (×3): 1 [drp] via OPHTHALMIC

## 2023-06-30 MED ORDER — TROPICAMIDE 1 % OP SOLN
1.0000 [drp] | OPHTHALMIC | Status: AC | PRN
  Administered 2023-06-30 (×3): 1 [drp] via OPHTHALMIC

## 2023-06-30 MED ORDER — MIDAZOLAM HCL 2 MG/2ML IJ SOLN
INTRAMUSCULAR | Status: AC
  Filled 2023-06-30: qty 2

## 2023-06-30 MED ORDER — SODIUM CHLORIDE 0.9% FLUSH
10.0000 mL | Freq: Two times a day (BID) | INTRAVENOUS | Status: DC
  Administered 2023-06-30: 5 mL via INTRAVENOUS
  Administered 2023-06-30: 10 mL via INTRAVENOUS

## 2023-06-30 MED ORDER — LIDOCAINE HCL 3.5 % OP GEL
1.0000 | Freq: Once | OPHTHALMIC | Status: AC
  Administered 2023-06-30: 1 via OPHTHALMIC

## 2023-06-30 MED ORDER — MOXIFLOXACIN HCL 5 MG/ML IO SOLN
INTRAOCULAR | Status: DC | PRN
  Administered 2023-06-30: .3 mL via OPHTHALMIC

## 2023-06-30 MED ORDER — BSS IO SOLN
INTRAOCULAR | Status: DC | PRN
  Administered 2023-06-30: 15 mL via INTRAOCULAR

## 2023-06-30 MED ORDER — POVIDONE-IODINE 5 % OP SOLN
OPHTHALMIC | Status: DC | PRN
  Administered 2023-06-30: 1 via OPHTHALMIC

## 2023-06-30 MED ORDER — TETRACAINE HCL 0.5 % OP SOLN
1.0000 [drp] | OPHTHALMIC | Status: AC | PRN
  Administered 2023-06-30 (×3): 1 [drp] via OPHTHALMIC

## 2023-06-30 MED ORDER — STERILE WATER FOR IRRIGATION IR SOLN
Status: DC | PRN
  Administered 2023-06-30: 250 mL

## 2023-06-30 MED ORDER — SIGHTPATH DOSE#1 NA HYALUR & NA CHOND-NA HYALUR IO KIT
PACK | INTRAOCULAR | Status: DC | PRN
  Administered 2023-06-30: 1 via OPHTHALMIC

## 2023-06-30 MED ORDER — LIDOCAINE HCL (PF) 1 % IJ SOLN
INTRAMUSCULAR | Status: DC | PRN
  Administered 2023-06-30: 1 mL

## 2023-06-30 SURGICAL SUPPLY — 15 items
CATARACT SUITE SIGHTPATH (MISCELLANEOUS) ×1
CLOTH BEACON ORANGE TIMEOUT ST (SAFETY) ×1 IMPLANT
DRSG TEGADERM 4X4.75 (GAUZE/BANDAGES/DRESSINGS) ×1 IMPLANT
EYE SHIELD UNIVERSAL CLEAR (GAUZE/BANDAGES/DRESSINGS) IMPLANT
FEE CATARACT SUITE SIGHTPATH (MISCELLANEOUS) ×1 IMPLANT
GLOVE BIOGEL PI IND STRL 7.0 (GLOVE) ×2 IMPLANT
LENS IOL RAYNER 20.0 (Intraocular Lens) ×1 IMPLANT
LENS IOL RAYONE EMV 20.0 (Intraocular Lens) IMPLANT
NDL HYPO 18GX1.5 BLUNT FILL (NEEDLE) ×1 IMPLANT
NEEDLE HYPO 18GX1.5 BLUNT FILL (NEEDLE) ×1
PAD ARMBOARD 7.5X6 YLW CONV (MISCELLANEOUS) ×1 IMPLANT
POSITIONER HEAD 8X9X4 ADT (SOFTGOODS) ×1 IMPLANT
SYR TB 1ML LL NO SAFETY (SYRINGE) ×1 IMPLANT
TAPE SURG TRANSPORE 1 IN (GAUZE/BANDAGES/DRESSINGS) IMPLANT
WATER STERILE IRR 250ML POUR (IV SOLUTION) ×1 IMPLANT

## 2023-06-30 NOTE — Anesthesia Preprocedure Evaluation (Signed)
Anesthesia Evaluation  Patient identified by MRN, date of birth, ID band Patient awake    Reviewed: Allergy & Precautions, H&P , NPO status , Patient's Chart, lab work & pertinent test results, reviewed documented beta blocker date and time   Airway Mallampati: II  TM Distance: >3 FB Neck ROM: full    Dental no notable dental hx.    Pulmonary pneumonia, former smoker   Pulmonary exam normal breath sounds clear to auscultation       Cardiovascular Exercise Tolerance: Good hypertension, + dysrhythmias  Rhythm:regular Rate:Normal     Neuro/Psych negative neurological ROS  negative psych ROS   GI/Hepatic negative GI ROS, Neg liver ROS,,,  Endo/Other  negative endocrine ROS    Renal/GU negative Renal ROS  negative genitourinary   Musculoskeletal   Abdominal   Peds  Hematology negative hematology ROS (+)   Anesthesia Other Findings   Reproductive/Obstetrics negative OB ROS                             Anesthesia Physical Anesthesia Plan  ASA: 2  Anesthesia Plan:    Post-op Pain Management:    Induction:   PONV Risk Score and Plan:   Airway Management Planned:   Additional Equipment:   Intra-op Plan:   Post-operative Plan:   Informed Consent: I have reviewed the patients History and Physical, chart, labs and discussed the procedure including the risks, benefits and alternatives for the proposed anesthesia with the patient or authorized representative who has indicated his/her understanding and acceptance.     Dental Advisory Given  Plan Discussed with: CRNA  Anesthesia Plan Comments:        Anesthesia Quick Evaluation

## 2023-06-30 NOTE — Transfer of Care (Signed)
Immediate Anesthesia Transfer of Care Note  Patient: Pamela Walter  Procedure(s) Performed: CATARACT EXTRACTION PHACO AND INTRAOCULAR LENS PLACEMENT (IOC) (Left)  Patient Location: PACU  Anesthesia Type:MAC  Level of Consciousness: awake and alert   Airway & Oxygen Therapy: Patient Spontanous Breathing  Post-op Assessment: Report given to RN and Post -op Vital signs reviewed and stable  Post vital signs: Reviewed and stable  Last Vitals:  Vitals Value Taken Time  BP 134/70 06/30/23 1028  Temp 36.6 C 06/30/23 1028  Pulse 62 06/30/23 1028  Resp 19 06/30/23 1028  SpO2 100 % 06/30/23 1028    Last Pain:  Vitals:   06/30/23 1028  TempSrc: Oral  PainSc: 0-No pain      Patients Stated Pain Goal: 5 (06/30/23 0805)  Complications: No notable events documented.

## 2023-06-30 NOTE — Op Note (Signed)
Date of procedure: 06/30/23  Pre-operative diagnosis: Visually significant age-related nuclear cataract, Left Eye (H25.12)  Post-operative diagnosis: Visually significant age-related nuclear cataract, Left Eye  Procedure: Removal of cataract via phacoemulsification and insertion of intra-ocular lens Rayner RayOne +20.0D into the capsular bag of the Left Eye  Attending surgeon: Ronal Fear, MD  Anesthesia: MAC, Topical Akten  Complications: None  Estimated Blood Loss: <43mL (minimal)  Specimens: None  Implants:  Implant Name Type Inv. Item Serial No. Manufacturer Lot No. LRB No. Used Action  LENS IOL RAYNER 20.0 - S56 Intraocular Lens LENS IOL RAYNER 20.0 56 [employer] [id number] Left 1 Implanted    Indications:  Visually significant age-related cataract, Left Eye  Procedure:  The patient was seen and identified in the pre-operative area. The operative eye was identified and dilated.  The operative eye was marked.  Topical anesthesia was administered to the operative eye.     The patient was then to the operative suite and placed in the supine position.  A timeout was performed confirming the patient, procedure to be performed, and all other relevant information.   The patient's face was prepped and draped in the usual fashion for intra-ocular surgery.  A lid speculum was placed into the operative eye and the surgical microscope moved into place and focused.  An inferotemporal paracentesis was created using a 20 gauge paracentesis blade.  BSS mixed with Omidria, followed by 1% lidocaine was injected into the anterior chamber.  Viscoelastic was injected into the anterior chamber.  A temporal clear-corneal main wound incision was created using a 2.52mm microkeratome.  A continuous curvilinear capsulorrhexis was initiated using an irrigating cystitome and completed using capsulorrhexis forceps.  Hydrodissection and hydrodeliniation were performed.  Viscoelastic was injected into the  anterior chamber.  A phacoemulsification handpiece and a chopper as a second instrument were used to remove the nucleus and epinucleus. The irrigation/aspiration handpiece was used to remove any remaining cortical material.   The capsular bag was reinflated with viscoelastic, checked, and found to be intact.  The intraocular lens was inserted into the capsular bag.  The irrigation/aspiration handpiece was used to remove any remaining viscoelastic.  The clear corneal wound and paracentesis wounds were then hydrated and checked with Weck-Cels to be watertight. Moxifloxacin was instilled into the anterior chamber.  The lid-speculum and drape was removed, and the patient's face was cleaned with a wet and dry 4x4.  A clear shield was taped over the eye. The patient was taken to the post-operative care unit in good condition, having tolerated the procedure well.  Post-Op Instructions: The patient will follow up at [facility] for a same day post-operative evaluation and will receive all other orders and instructions.

## 2023-06-30 NOTE — Discharge Instructions (Signed)
Please discharge patient when stable, will follow up today with Dr. Tod Abrahamsen at the [facility] [place] office immediately following discharge.  Leave shield in place until visit.  All paperwork with discharge instructions will be given at the office.  [facility] Address:  [address]  [place], [place] [address]  Dr. Feliciano Wynter's Phone: [phone]  

## 2023-06-30 NOTE — Interval H&P Note (Signed)
History and Physical Interval Note:  06/30/2023 8:49 AM  The H and P was reviewed and updated. The patient was examined.  No changes were found after exam.  The surgical eye was marked.   Ashar Lewinski

## 2023-06-30 NOTE — Interval H&P Note (Signed)
History and Physical Interval Note:  06/30/2023 8:58 AM  The H and P was reviewed and updated. The patient was examined.  No changes were found after exam.  The surgical eye was marked.  Pamela Walter

## 2023-07-02 NOTE — Anesthesia Postprocedure Evaluation (Signed)
Anesthesia Post Note  Patient: Pamela Walter  Procedure(s) Performed: CATARACT EXTRACTION PHACO AND INTRAOCULAR LENS PLACEMENT (IOC) (Left)  Patient location during evaluation: Phase II Anesthesia Type: General Level of consciousness: awake Pain management: pain level controlled Vital Signs Assessment: post-procedure vital signs reviewed and stable Respiratory status: spontaneous breathing and respiratory function stable Cardiovascular status: blood pressure returned to baseline and stable Postop Assessment: no headache and no apparent nausea or vomiting Anesthetic complications: no Comments: Late entry   No notable events documented.   Last Vitals:  Vitals:   06/30/23 0805 06/30/23 1028  BP: 135/62 134/70  Pulse: 64 62  Resp: 18 19  Temp: 36.6 C 36.6 C  SpO2: 100% 100%    Last Pain:  Vitals:   06/30/23 1028  TempSrc: Oral  PainSc: 0-No pain                 Windell Norfolk

## 2023-07-05 ENCOUNTER — Encounter (HOSPITAL_COMMUNITY): Payer: Self-pay | Admitting: Optometry

## 2023-08-08 DIAGNOSIS — H59031 Cystoid macular edema following cataract surgery, right eye: Secondary | ICD-10-CM | POA: Diagnosis not present

## 2023-08-18 DIAGNOSIS — E782 Mixed hyperlipidemia: Secondary | ICD-10-CM | POA: Diagnosis not present

## 2023-08-18 DIAGNOSIS — E1169 Type 2 diabetes mellitus with other specified complication: Secondary | ICD-10-CM | POA: Diagnosis not present

## 2023-08-18 DIAGNOSIS — I10 Essential (primary) hypertension: Secondary | ICD-10-CM | POA: Diagnosis not present

## 2023-08-18 DIAGNOSIS — M130 Polyarthritis, unspecified: Secondary | ICD-10-CM | POA: Diagnosis not present

## 2023-08-21 DIAGNOSIS — R7303 Prediabetes: Secondary | ICD-10-CM | POA: Diagnosis not present

## 2023-08-21 DIAGNOSIS — I10 Essential (primary) hypertension: Secondary | ICD-10-CM | POA: Diagnosis not present

## 2023-08-21 DIAGNOSIS — E785 Hyperlipidemia, unspecified: Secondary | ICD-10-CM | POA: Diagnosis not present

## 2023-08-31 ENCOUNTER — Ambulatory Visit
Admission: EM | Admit: 2023-08-31 | Discharge: 2023-08-31 | Disposition: A | Discharge status: Home / Self Care | Payer: Medicare HMO | Attending: Family Medicine | Admitting: Family Medicine

## 2023-08-31 DIAGNOSIS — J069 Acute upper respiratory infection, unspecified: Secondary | ICD-10-CM | POA: Diagnosis not present

## 2023-08-31 LAB — POC COVID19/FLU A&B COMBO
Covid Antigen, POC: NEGATIVE
Influenza A Antigen, POC: NEGATIVE
Influenza B Antigen, POC: NEGATIVE

## 2023-08-31 MED ORDER — BENZONATATE 200 MG PO CAPS: 200.0000 mg | ORAL_CAPSULE | Freq: Three times a day (TID) | ORAL | 0 refills | Status: AC | PRN

## 2023-08-31 MED ORDER — OSELTAMIVIR PHOSPHATE 75 MG PO CAPS
75.0000 mg | ORAL_CAPSULE | Freq: Two times a day (BID) | ORAL | 0 refills | Status: AC
Start: 2023-08-31 — End: ?

## 2023-08-31 MED ORDER — AZELASTINE HCL 0.1 % NA SOLN: 1.0000 | Freq: Two times a day (BID) | NASAL | 0 refills | Status: AC

## 2023-08-31 NOTE — ED Provider Notes (Signed)
[facility]    CSN: [id number] Arrival date & time: 08/31/23  9562      History   Chief Complaint Chief Complaint  Patient presents with   Cough    HPI Pamela Walter is a 73 y.o. female.   Patient presenting today with 1 day history of cough, drainage, sore throat, fatigue, chills.  Denies fever, body aches, chest pain, shortness of breath, abdominal pain, vomiting, diarrhea.  So far trying Tylenol and Vicks cough medication with minimal relief.  Multiple sick family members recently.  No known history of chronic pulmonary disease.    Past Medical History:  Diagnosis Date   Arthritis    Dysrhythmia    Hyperlipidemia    borderline   Hypertension    Irregular heart rate    Pneumonia    2000 per patient   Vaginal discharge 06/05/2014    Patient Active Problem List   Diagnosis Date Noted   Impingement syndrome of left shoulder 04/08/2021   S/P TKR (total knee replacement) using cement, right 11/16/2020   Unilateral primary osteoarthritis, right knee 03/11/2020   H/O calcium pyrophosphate deposition disease (CPPD) 07/14/2016   Vaginal discharge 06/05/2014    Past Surgical History:  Procedure Laterality Date   APPENDECTOMY     CATARACT EXTRACTION W/PHACO Right 11/11/2022   Procedure: CATARACT EXTRACTION PHACO AND INTRAOCULAR LENS PLACEMENT (IOC);  Surgeon: Pecolia Ades, MD;  Location: [facility];  Service: Ophthalmology;  Laterality: Right;  CDE: 7.28   CATARACT EXTRACTION W/PHACO Left 06/30/2023   Procedure: CATARACT EXTRACTION PHACO AND INTRAOCULAR LENS PLACEMENT (IOC);  Surgeon: Pecolia Ades, MD;  Location: [facility];  Service: Ophthalmology;  Laterality: Left;  CDE: 2.78   CHOLECYSTECTOMY     30 years ago   COLONOSCOPY  03/01/2011   Procedure: COLONOSCOPY;  Surgeon: Arlyce Harman, MD;  Location: [facility];  Service: Endoscopy;  Laterality: N/A;   COLONOSCOPY WITH PROPOFOL N/A 07/15/2022   Procedure: COLONOSCOPY WITH PROPOFOL;  Surgeon:  Lanelle Bal, DO;  Location: [facility];  Service: Endoscopy;  Laterality: N/A;  9:30 am   POLYPECTOMY  07/15/2022   Procedure: POLYPECTOMY;  Surgeon: Lanelle Bal, DO;  Location: [facility];  Service: Endoscopy;;   TEMPOROMANDIBULAR JOINT SURGERY  1993   TOTAL KNEE ARTHROPLASTY Right 11/16/2020   Procedure: RIGHT TOTAL KNEE ARTHROPLASTY;  Surgeon: Eldred Manges, MD;  Location: [facility];  Service: Orthopedics;  Laterality: Right;    OB History     Gravida  4   Para  2   Term      Preterm      AB  2   Living  2      SAB  2   IAB      Ectopic      Multiple      Live Births               Home Medications    Prior to Admission medications   Medication Sig Start Date End Date Taking? Authorizing Provider  azelastine (ASTELIN) 0.1 % nasal spray Place 1 spray into both nostrils 2 (two) times daily. Use in each nostril as directed 08/31/23  Yes Particia Nearing, PA-C  benzonatate (TESSALON) 200 MG capsule Take 1 capsule (200 mg total) by mouth 3 (three) times daily as needed for cough. 08/31/23  Yes Particia Nearing, PA-C  oseltamivir (TAMIFLU) 75 MG capsule Take 1 capsule (75 mg total) by mouth every 12 (twelve) hours.  08/31/23  Yes Particia Nearing, PA-C  amLODipine (NORVASC) 5 MG tablet Take 5 mg by mouth daily.  04/21/16   [provider]  clonazePAM (KLONOPIN) 0.5 MG tablet Take 0.5 mg by mouth at bedtime as needed (sleep).    [provider]  D3-50 1.25 MG (50000 UT) capsule Take 50,000 Units by mouth once a week. 07/24/20   [provider]  metoprolol tartrate (LOPRESSOR) 25 MG tablet Take 25 mg by mouth daily.    [provider]  rosuvastatin (CRESTOR) 10 MG tablet Take 10 mg by mouth daily. 12/23/20   [provider]    Family History Family History  Problem Relation Age of Onset   Hypertension Mother    Diabetes Mother    Cancer Mother    Hypertension Father    Heart attack Father     Hypertension Sister    Cancer Brother        prostate   Diabetes Son    Diabetes Brother    Dementia Brother     Social History Social History   Tobacco Use   Smoking status: Former    Current packs/day: 0.00    Average packs/day: 0.5 packs/day for 30.0 years (15.0 ttl pk-yrs)    Types: Cigarettes    Start date: 62    Quit date: 2013    Years since quitting: 12.1   Smokeless tobacco: Never  Vaping Use   Vaping status: Never Used  Substance Use Topics   Alcohol use: Yes    Comment: wine once a month   Drug use: No     Allergies   Patient has no known allergies.   Review of Systems Review of Systems Per HPI  Physical Exam Triage Vital Signs ED Triage Vitals  Encounter Vitals Group     BP 08/31/23 1006 (!) 144/67     Systolic BP Percentile --      Diastolic BP Percentile --      Pulse Rate 08/31/23 1006 85     Resp 08/31/23 1006 16     Temp 08/31/23 1006 99.3 F (37.4 C)     Temp Source 08/31/23 1006 Oral     SpO2 08/31/23 1006 94 %     Weight 08/31/23 1007 190 lb (86.2 kg)     Height 08/31/23 1007 5\' 4"  (1.626 m)     Head Circumference --      Peak Flow --      Pain Score 08/31/23 1007 0     Pain Loc --      Pain Education --      Exclude from Growth Chart --    No data found.  Updated Vital Signs BP (!) 144/67 (BP Location: Left Arm)   Pulse 85   Temp 99.3 F (37.4 C) (Oral)   Resp 16   Ht 5\' 4"  (1.626 m)   Wt 190 lb (86.2 kg)   SpO2 94%   BMI 32.61 kg/m   Visual Acuity Right Eye Distance:   Left Eye Distance:   Bilateral Distance:    Right Eye Near:   Left Eye Near:    Bilateral Near:     Physical Exam Vitals and nursing note reviewed.  Constitutional:      Appearance: Normal appearance.  HENT:     Head: Atraumatic.     Right Ear: Tympanic membrane and external ear normal.     Left Ear: Tympanic membrane and external ear normal.     Nose: Rhinorrhea  present.     Mouth/Throat:     Mouth: Mucous membranes are moist.      Pharynx: Posterior oropharyngeal erythema present.  Eyes:     Extraocular Movements: Extraocular movements intact.     Conjunctiva/sclera: Conjunctivae normal.  Cardiovascular:     Rate and Rhythm: Normal rate and regular rhythm.     Heart sounds: Normal heart sounds.  Pulmonary:     Effort: Pulmonary effort is normal.     Breath sounds: Normal breath sounds. No wheezing or rales.  Musculoskeletal:        General: Normal range of motion.     Cervical back: Normal range of motion and neck supple.  Skin:    General: Skin is warm and dry.  Neurological:     Mental Status: She is alert and oriented to person, place, and time.  Psychiatric:        Mood and Affect: Mood normal.        Thought Content: Thought content normal.      UC Treatments / Results  Labs (all labs ordered are listed, but only abnormal results are displayed) Labs Reviewed  POC COVID19/FLU A&B COMBO    EKG   Radiology No results found.  Procedures Procedures (including critical care time)  Medications Ordered in UC Medications - No data to display  Initial Impression / Assessment and Plan / UC Course  I have reviewed the triage vital signs and the nursing notes.  Pertinent labs & imaging results that were available during my care of the patient were reviewed by me and considered in my medical decision making (see chart for details).     Vitals and exam reassuring today, rapid flu and COVID-negative but do suspect a viral upper respiratory infection.  Given exposures to flu in the community we will send Tamiflu in case flu and treat with Tessalon, Astelin, supportive over-the-counter medications and home care.  Return for worsening symptoms.  Final Clinical Impressions(s) / UC Diagnoses   Final diagnoses:  Viral URI with cough     Discharge Instructions      Your COVID and flu testing was negative today but I am suspicious for flu so I have sent over the antiviral medication Tamiflu as well  as some medications for your symptoms.  You may take Coricidin HBP, plain Mucinex, Tylenol as well.    ED Prescriptions     Medication Sig Dispense Auth. Provider   oseltamivir (TAMIFLU) 75 MG capsule Take 1 capsule (75 mg total) by mouth every 12 (twelve) hours. 10 capsule Particia Nearing, PA-C   benzonatate (TESSALON) 200 MG capsule Take 1 capsule (200 mg total) by mouth 3 (three) times daily as needed for cough. 20 capsule Particia Nearing, [place]   azelastine (ASTELIN) 0.1 % nasal spray Place 1 spray into both nostrils 2 (two) times daily. Use in each nostril as directed 30 mL Particia Nearing, PA-C      PDMP not reviewed this encounter.   Particia Nearing, [place] 08/31/23 1208

## 2023-08-31 NOTE — ED Triage Notes (Signed)
Patient here today with c/o cough, nasal drainage, and ST X 1 day. She has taken Tylenol last night and Vicks cough medicine with no relief. Her grandchildren have also been sick.

## 2023-08-31 NOTE — Discharge Instructions (Signed)
Your COVID and flu testing was negative today but I am suspicious for flu so I have sent over the antiviral medication Tamiflu as well as some medications for your symptoms.  You may take Coricidin HBP, plain Mucinex, Tylenol as well.

## 2023-09-19 DIAGNOSIS — R7303 Prediabetes: Secondary | ICD-10-CM | POA: Diagnosis not present

## 2023-09-19 DIAGNOSIS — I10 Essential (primary) hypertension: Secondary | ICD-10-CM | POA: Diagnosis not present

## 2023-09-19 DIAGNOSIS — E785 Hyperlipidemia, unspecified: Secondary | ICD-10-CM | POA: Diagnosis not present

## 2023-11-08 DIAGNOSIS — H59031 Cystoid macular edema following cataract surgery, right eye: Secondary | ICD-10-CM | POA: Diagnosis not present

## 2023-11-08 DIAGNOSIS — Z9841 Cataract extraction status, right eye: Secondary | ICD-10-CM | POA: Diagnosis not present

## 2023-11-08 DIAGNOSIS — Z9842 Cataract extraction status, left eye: Secondary | ICD-10-CM | POA: Diagnosis not present

## 2024-01-01 DIAGNOSIS — E559 Vitamin D deficiency, unspecified: Secondary | ICD-10-CM | POA: Diagnosis not present

## 2024-01-01 DIAGNOSIS — F5102 Adjustment insomnia: Secondary | ICD-10-CM | POA: Diagnosis not present

## 2024-01-01 DIAGNOSIS — R7303 Prediabetes: Secondary | ICD-10-CM | POA: Diagnosis not present

## 2024-01-01 DIAGNOSIS — E785 Hyperlipidemia, unspecified: Secondary | ICD-10-CM | POA: Diagnosis not present

## 2024-01-01 DIAGNOSIS — E1165 Type 2 diabetes mellitus with hyperglycemia: Secondary | ICD-10-CM | POA: Diagnosis not present

## 2024-01-01 DIAGNOSIS — M130 Polyarthritis, unspecified: Secondary | ICD-10-CM | POA: Diagnosis not present

## 2024-01-01 DIAGNOSIS — I10 Essential (primary) hypertension: Secondary | ICD-10-CM | POA: Diagnosis not present

## 2024-02-07 DIAGNOSIS — H59032 Cystoid macular edema following cataract surgery, left eye: Secondary | ICD-10-CM | POA: Diagnosis not present

## 2024-02-07 DIAGNOSIS — H59033 Cystoid macular edema following cataract surgery, bilateral: Secondary | ICD-10-CM | POA: Diagnosis not present

## 2024-02-07 DIAGNOSIS — H59031 Cystoid macular edema following cataract surgery, right eye: Secondary | ICD-10-CM | POA: Diagnosis not present

## 2024-03-13 ENCOUNTER — Other Ambulatory Visit (HOSPITAL_COMMUNITY): Payer: Self-pay | Admitting: Family Medicine

## 2024-03-13 DIAGNOSIS — Z1231 Encounter for screening mammogram for malignant neoplasm of breast: Secondary | ICD-10-CM

## 2024-03-27 ENCOUNTER — Encounter (HOSPITAL_COMMUNITY): Payer: Self-pay

## 2024-03-27 ENCOUNTER — Ambulatory Visit (HOSPITAL_COMMUNITY)
Admission: RE | Admit: 2024-03-27 | Discharge: 2024-03-27 | Disposition: A | Discharge status: Home / Self Care | Source: Ambulatory Visit | Attending: Family Medicine | Admitting: Family Medicine

## 2024-03-27 DIAGNOSIS — Z1231 Encounter for screening mammogram for malignant neoplasm of breast: Secondary | ICD-10-CM | POA: Insufficient documentation

## 2024-05-02 DIAGNOSIS — Z6832 Body mass index (BMI) 32.0-32.9, adult: Secondary | ICD-10-CM | POA: Diagnosis not present

## 2024-05-02 DIAGNOSIS — R634 Abnormal weight loss: Secondary | ICD-10-CM | POA: Diagnosis not present

## 2024-05-02 DIAGNOSIS — R7303 Prediabetes: Secondary | ICD-10-CM | POA: Diagnosis not present

## 2024-05-02 DIAGNOSIS — I10 Essential (primary) hypertension: Secondary | ICD-10-CM | POA: Diagnosis not present

## 2024-05-02 DIAGNOSIS — R600 Localized edema: Secondary | ICD-10-CM | POA: Diagnosis not present

## 2024-06-11 DIAGNOSIS — R7303 Prediabetes: Secondary | ICD-10-CM | POA: Diagnosis not present

## 2024-06-11 DIAGNOSIS — E785 Hyperlipidemia, unspecified: Secondary | ICD-10-CM | POA: Diagnosis not present

## 2024-06-11 DIAGNOSIS — R609 Edema, unspecified: Secondary | ICD-10-CM | POA: Diagnosis not present
